# Patient Record
Sex: Female | Born: 1975 | Race: White | Hispanic: No | Marital: Single | Smoking: Current every day smoker
Health system: Southern US, Community
[De-identification: ages and names within clinical notes are randomized; demographics above are authoritative.]

## PROBLEM LIST (undated history)

## (undated) DIAGNOSIS — F329 Major depressive disorder, single episode, unspecified: Secondary | ICD-10-CM

## (undated) DIAGNOSIS — R87619 Unspecified abnormal cytological findings in specimens from cervix uteri: Secondary | ICD-10-CM

## (undated) DIAGNOSIS — F419 Anxiety disorder, unspecified: Secondary | ICD-10-CM

## (undated) DIAGNOSIS — IMO0002 Reserved for concepts with insufficient information to code with codable children: Secondary | ICD-10-CM

## (undated) DIAGNOSIS — F32A Depression, unspecified: Secondary | ICD-10-CM

## (undated) HISTORY — PX: CHOLECYSTECTOMY, LAPAROSCOPIC: SHX56

## (undated) HISTORY — DX: Major depressive disorder, single episode, unspecified: F32.9

## (undated) HISTORY — DX: Unspecified abnormal cytological findings in specimens from cervix uteri: R87.619

## (undated) HISTORY — DX: Reserved for concepts with insufficient information to code with codable children: IMO0002

## (undated) HISTORY — DX: Depression, unspecified: F32.A

## (undated) HISTORY — DX: Anxiety disorder, unspecified: F41.9

## (undated) HISTORY — PX: OTHER SURGICAL HISTORY: SHX169

---

## 2003-03-21 ENCOUNTER — Emergency Department (HOSPITAL_COMMUNITY): Admission: EM | Admit: 2003-03-21 | Discharge: 2003-03-21 | Payer: Self-pay | Admitting: Emergency Medicine

## 2003-10-25 HISTORY — PX: LEEP: SHX91

## 2005-11-22 ENCOUNTER — Emergency Department (HOSPITAL_COMMUNITY): Admission: EM | Admit: 2005-11-22 | Discharge: 2005-11-22 | Payer: Self-pay | Admitting: Emergency Medicine

## 2013-06-05 ENCOUNTER — Ambulatory Visit (INDEPENDENT_AMBULATORY_CARE_PROVIDER_SITE_OTHER): Payer: Medicaid Other | Admitting: Adult Health

## 2013-06-05 ENCOUNTER — Encounter: Payer: Self-pay | Admitting: Adult Health

## 2013-06-05 VITALS — BP 98/64 | Ht 64.0 in | Wt 208.0 lb

## 2013-06-05 DIAGNOSIS — Z3201 Encounter for pregnancy test, result positive: Secondary | ICD-10-CM

## 2013-06-05 DIAGNOSIS — Z3202 Encounter for pregnancy test, result negative: Secondary | ICD-10-CM

## 2013-06-05 LAB — POCT URINE PREGNANCY: Preg Test, Ur: POSITIVE

## 2013-06-11 ENCOUNTER — Other Ambulatory Visit: Payer: Self-pay | Admitting: Obstetrics & Gynecology

## 2013-06-11 DIAGNOSIS — O3680X0 Pregnancy with inconclusive fetal viability, not applicable or unspecified: Secondary | ICD-10-CM

## 2013-06-13 ENCOUNTER — Ambulatory Visit (INDEPENDENT_AMBULATORY_CARE_PROVIDER_SITE_OTHER): Payer: Medicaid Other

## 2013-06-13 DIAGNOSIS — O09521 Supervision of elderly multigravida, first trimester: Secondary | ICD-10-CM

## 2013-06-13 DIAGNOSIS — O3680X0 Pregnancy with inconclusive fetal viability, not applicable or unspecified: Secondary | ICD-10-CM

## 2013-06-13 DIAGNOSIS — O26849 Uterine size-date discrepancy, unspecified trimester: Secondary | ICD-10-CM

## 2013-06-13 DIAGNOSIS — O09529 Supervision of elderly multigravida, unspecified trimester: Secondary | ICD-10-CM

## 2013-06-13 NOTE — Progress Notes (Signed)
U/S-single IUP with +FCA noted, CRL c/w 10+1wks EDD 01/08/2014, cx long and closed 3.8cm, bilateral adnexa WNL

## 2013-06-14 ENCOUNTER — Other Ambulatory Visit: Payer: Self-pay | Admitting: Obstetrics & Gynecology

## 2013-06-14 DIAGNOSIS — Z36 Encounter for antenatal screening of mother: Secondary | ICD-10-CM

## 2013-06-14 DIAGNOSIS — O09521 Supervision of elderly multigravida, first trimester: Secondary | ICD-10-CM

## 2013-06-27 ENCOUNTER — Encounter: Payer: Self-pay | Admitting: Advanced Practice Midwife

## 2013-06-27 ENCOUNTER — Ambulatory Visit (INDEPENDENT_AMBULATORY_CARE_PROVIDER_SITE_OTHER): Payer: Medicaid Other | Admitting: Advanced Practice Midwife

## 2013-06-27 ENCOUNTER — Other Ambulatory Visit (HOSPITAL_COMMUNITY)
Admission: RE | Admit: 2013-06-27 | Discharge: 2013-06-27 | Disposition: A | Discharge status: Home / Self Care | Payer: Medicaid Other | Source: Ambulatory Visit | Attending: Advanced Practice Midwife | Admitting: Advanced Practice Midwife

## 2013-06-27 ENCOUNTER — Other Ambulatory Visit: Payer: Self-pay | Admitting: Advanced Practice Midwife

## 2013-06-27 ENCOUNTER — Ambulatory Visit (INDEPENDENT_AMBULATORY_CARE_PROVIDER_SITE_OTHER): Payer: Medicaid Other

## 2013-06-27 VITALS — BP 102/80 | Wt 208.5 lb

## 2013-06-27 DIAGNOSIS — O3440 Maternal care for other abnormalities of cervix, unspecified trimester: Secondary | ICD-10-CM

## 2013-06-27 DIAGNOSIS — O09291 Supervision of pregnancy with other poor reproductive or obstetric history, first trimester: Secondary | ICD-10-CM

## 2013-06-27 DIAGNOSIS — O99340 Other mental disorders complicating pregnancy, unspecified trimester: Secondary | ICD-10-CM

## 2013-06-27 DIAGNOSIS — O09521 Supervision of elderly multigravida, first trimester: Secondary | ICD-10-CM

## 2013-06-27 DIAGNOSIS — Z1151 Encounter for screening for human papillomavirus (HPV): Secondary | ICD-10-CM | POA: Insufficient documentation

## 2013-06-27 DIAGNOSIS — O09299 Supervision of pregnancy with other poor reproductive or obstetric history, unspecified trimester: Secondary | ICD-10-CM

## 2013-06-27 DIAGNOSIS — O360990 Maternal care for other rhesus isoimmunization, unspecified trimester, not applicable or unspecified: Secondary | ICD-10-CM

## 2013-06-27 DIAGNOSIS — F419 Anxiety disorder, unspecified: Secondary | ICD-10-CM | POA: Insufficient documentation

## 2013-06-27 DIAGNOSIS — Z331 Pregnant state, incidental: Secondary | ICD-10-CM

## 2013-06-27 DIAGNOSIS — Z1389 Encounter for screening for other disorder: Secondary | ICD-10-CM

## 2013-06-27 DIAGNOSIS — Z3490 Encounter for supervision of normal pregnancy, unspecified, unspecified trimester: Secondary | ICD-10-CM | POA: Insufficient documentation

## 2013-06-27 DIAGNOSIS — O09529 Supervision of elderly multigravida, unspecified trimester: Secondary | ICD-10-CM

## 2013-06-27 DIAGNOSIS — F329 Major depressive disorder, single episode, unspecified: Secondary | ICD-10-CM | POA: Insufficient documentation

## 2013-06-27 DIAGNOSIS — Z113 Encounter for screening for infections with a predominantly sexual mode of transmission: Secondary | ICD-10-CM | POA: Insufficient documentation

## 2013-06-27 DIAGNOSIS — Z9889 Other specified postprocedural states: Secondary | ICD-10-CM | POA: Insufficient documentation

## 2013-06-27 DIAGNOSIS — Z36 Encounter for antenatal screening of mother: Secondary | ICD-10-CM

## 2013-06-27 DIAGNOSIS — Z01419 Encounter for gynecological examination (general) (routine) without abnormal findings: Secondary | ICD-10-CM | POA: Insufficient documentation

## 2013-06-27 DIAGNOSIS — Z3481 Encounter for supervision of other normal pregnancy, first trimester: Secondary | ICD-10-CM

## 2013-06-27 LAB — CBC
HCT: 39.9 % (ref 36.0–46.0)
Hemoglobin: 13.4 g/dL (ref 12.0–15.0)
MCV: 91.7 fL (ref 78.0–100.0)
RBC: 4.35 MIL/uL (ref 3.87–5.11)
RDW: 13.9 % (ref 11.5–15.5)
WBC: 10 10*3/uL (ref 4.0–10.5)

## 2013-06-27 LAB — POCT URINALYSIS DIPSTICK
Glucose, UA: NEGATIVE
Leukocytes, UA: NEGATIVE
Nitrite, UA: NEGATIVE

## 2013-06-27 MED ORDER — CONCEPT DHA 53.5-38-1 MG PO CAPS: 1.0000 | ORAL_CAPSULE | ORAL | Status: DC

## 2013-06-27 NOTE — Progress Notes (Signed)
Pain after intercourse. Backpain yesterday. New OB packet given. Consents signed. CCNC filled out.

## 2013-06-27 NOTE — Progress Notes (Signed)
  Subjective:    Karen Kline is a [id number] [redacted]w[redacted]d being seen today for her first obstetrical visit.  Her obstetrical history is significant for HIstory of baby tetrology of fallot..  Pregnancy history fully reviewed.  Patient reports backache and no bleeding.  Filed Vitals:   06/27/13 1036  BP: 102/80  Weight: 208 lb 8 oz (94.575 kg)    HISTORY: OB History  Gravida Para Term Preterm AB SAB TAB Ectopic Multiple Living  7 4 4  2 1 1   4     # Outcome Date GA Lbr Len/2nd Weight Sex Delivery Anes PTL Lv  7 CUR           6 TRM 2013 [redacted]w[redacted]d  6 lb 13 oz (3.09 kg) F SVD EPI  Y     Comments: tetrology of fallot; elective IOL  5 SAB 2000          4 TAB 1999          3 TRM 1999 [redacted]w[redacted]d  6 lb 8 oz (2.948 kg) F SVD   Y  2 TRM 1996 [redacted]w[redacted]d  6 lb (2.722 kg) M SVD None  Y     Comments: elective IOL  1 TRM 1994 [redacted]w[redacted]d  6 lb 8 oz (2.948 kg) F SVD None  Y     Past Medical History  Diagnosis Date  . Anxiety   . Abnormal Pap smear   . Depression    Past Surgical History  Procedure Laterality Date  . Leep  2005  . Cholecystectomy    . Tooth abstraction     Family History  Problem Relation Age of Onset  . Cancer Other     breast  . Emphysema Other     father  . Heart disease Daughter     congenital heart defect     Exam       Pelvic Exam:    Perineum: Normal Perineum   Vulva: normal   Vagina:  normal mucosa, normal discharge, no palpable nodules   Uterus         Cervix: normal   Adnexa: Not palpable   Urinary:  urethral meatus normal    System:     Skin: normal coloration and turgor, no rashes    Neurologic: oriented, normal, normal mood   Extremities: normal strength, tone, and muscle mass   HEENT PERRLA   Mouth/Teeth mucous membranes moist, pharynx normal without lesions   Neck supple and no masses   Cardiovascular: regular rate and rhythm   Respiratory:  appears well, vitals normal, no respiratory distress, acyanotic, normal RR   Abdomen: soft, non-tender; bowel sounds  normal; no masses,  no organomegaly          Assessment:    Pregnancy: [id number] Patient Active Problem List   Diagnosis Date Noted  . Pregnant 06/27/2013  . History of loop electrical excision procedure (LEEP) 06/27/2013  . Depression 06/27/2013  . Anxiety 06/27/2013  . Prior pregnancy with congenital cardiac defect, antepartum 06/27/2013        Plan:     Initial labs drawn. Prenatal vitamins. Problem list reviewed and updated. Pt refuses to decrease xanax.  Cat D/neonatal withdrawal discussed Genetic Screening discussed Integrated Screen: requested.  Ultrasound discussed; fetal survey: requested. Will get fetal echo after 20 weeks  Follow up in 4 weeks.  CRESENZO-DISHMAN,Joseeduardo Brix 06/27/2013

## 2013-06-27 NOTE — Progress Notes (Signed)
U/S(12+1wks)-single IUP with +FCA noted, CRL c/w dates, NB present, NT=1.57mm, bilateral adnexa wnl, no free fluid noted, cx long and closed 3.3cm

## 2013-06-28 LAB — URINALYSIS
Bilirubin Urine: NEGATIVE
Glucose, UA: NEGATIVE mg/dL
Hgb urine dipstick: NEGATIVE
Leukocytes, UA: NEGATIVE
Nitrite: NEGATIVE
Protein, ur: NEGATIVE mg/dL
Specific Gravity, Urine: >1.03 — ABNORMAL HIGH (ref 1.005–1.030)
Urobilinogen, UA: 0.2 mg/dL (ref 0.0–1.0)
pH: 6 (ref 5.0–8.0)

## 2013-06-28 LAB — DRUG SCREEN, URINE, NO CONFIRMATION
Amphetamine Screen, Ur: NEGATIVE
Benzodiazepines.: POSITIVE — AB
Cocaine Metabolites: NEGATIVE
Creatinine,U: 242.6 mg/dL
Phencyclidine (PCP): NEGATIVE

## 2013-06-28 LAB — CYSTIC FIBROSIS DIAGNOSTIC STUDY

## 2013-06-28 LAB — URINE CULTURE
Colony Count: NO GROWTH
Organism ID, Bacteria: NO GROWTH

## 2013-06-28 LAB — ABO AND RH: Rh Type: NEGATIVE

## 2013-06-28 LAB — OXYCODONE SCREEN, UA, RFLX CONFIRM: Oxycodone Screen, Ur: NEGATIVE ng/mL

## 2013-06-28 LAB — VARICELLA ZOSTER ANTIBODY, IGG: Varicella IgG: 186.4 Index — ABNORMAL HIGH (ref <135.00)

## 2013-07-25 ENCOUNTER — Other Ambulatory Visit: Payer: Self-pay | Admitting: Advanced Practice Midwife

## 2013-07-25 ENCOUNTER — Ambulatory Visit (INDEPENDENT_AMBULATORY_CARE_PROVIDER_SITE_OTHER): Payer: Medicaid Other | Admitting: Advanced Practice Midwife

## 2013-07-25 ENCOUNTER — Encounter: Payer: Self-pay | Admitting: Advanced Practice Midwife

## 2013-07-25 VITALS — BP 100/60 | Wt 215.0 lb

## 2013-07-25 DIAGNOSIS — O09299 Supervision of pregnancy with other poor reproductive or obstetric history, unspecified trimester: Secondary | ICD-10-CM

## 2013-07-25 DIAGNOSIS — Z3490 Encounter for supervision of normal pregnancy, unspecified, unspecified trimester: Secondary | ICD-10-CM

## 2013-07-25 DIAGNOSIS — O99340 Other mental disorders complicating pregnancy, unspecified trimester: Secondary | ICD-10-CM

## 2013-07-25 DIAGNOSIS — O360990 Maternal care for other rhesus isoimmunization, unspecified trimester, not applicable or unspecified: Secondary | ICD-10-CM

## 2013-07-25 DIAGNOSIS — Z331 Pregnant state, incidental: Secondary | ICD-10-CM

## 2013-07-25 DIAGNOSIS — Z1389 Encounter for screening for other disorder: Secondary | ICD-10-CM

## 2013-07-25 DIAGNOSIS — O3440 Maternal care for other abnormalities of cervix, unspecified trimester: Secondary | ICD-10-CM

## 2013-07-25 DIAGNOSIS — Z3482 Encounter for supervision of other normal pregnancy, second trimester: Secondary | ICD-10-CM

## 2013-07-25 LAB — POCT URINALYSIS DIPSTICK
Leukocytes, UA: NEGATIVE
Protein, UA: NEGATIVE

## 2013-07-25 NOTE — Progress Notes (Signed)
Pt here today for routine visit and 2nd IT. Pt denies any problems or concerns at this time. Discussed low carb modified diet. F/U 3 weeks for anatomy scan.

## 2013-07-31 LAB — MATERNAL SCREEN, INTEGRATED #2
AFP MoM: 0.99
AFP, Serum: 30.1 ng/mL
Crown Rump Length: 59.9 mm
Estriol Mom: 0.74
Estriol, Free: 0.61 ng/mL
MSS Trisomy 18 Risk: <1:5000 {titer}
Nuchal Translucency: 1.36 mm
PAPP-A MoM: 3.39
hCG, Serum: 30.1 IU/mL

## 2013-08-15 ENCOUNTER — Other Ambulatory Visit: Payer: Self-pay | Admitting: Advanced Practice Midwife

## 2013-08-15 ENCOUNTER — Ambulatory Visit (INDEPENDENT_AMBULATORY_CARE_PROVIDER_SITE_OTHER): Payer: Medicaid Other | Admitting: Obstetrics & Gynecology

## 2013-08-15 ENCOUNTER — Ambulatory Visit (INDEPENDENT_AMBULATORY_CARE_PROVIDER_SITE_OTHER): Payer: Medicaid Other

## 2013-08-15 ENCOUNTER — Encounter: Payer: Self-pay | Admitting: Obstetrics & Gynecology

## 2013-08-15 VITALS — BP 100/60 | Wt 214.0 lb

## 2013-08-15 DIAGNOSIS — O09522 Supervision of elderly multigravida, second trimester: Secondary | ICD-10-CM

## 2013-08-15 DIAGNOSIS — O3440 Maternal care for other abnormalities of cervix, unspecified trimester: Secondary | ICD-10-CM

## 2013-08-15 DIAGNOSIS — O09292 Supervision of pregnancy with other poor reproductive or obstetric history, second trimester: Secondary | ICD-10-CM

## 2013-08-15 DIAGNOSIS — O99340 Other mental disorders complicating pregnancy, unspecified trimester: Secondary | ICD-10-CM

## 2013-08-15 DIAGNOSIS — Z3482 Encounter for supervision of other normal pregnancy, second trimester: Secondary | ICD-10-CM

## 2013-08-15 DIAGNOSIS — O360990 Maternal care for other rhesus isoimmunization, unspecified trimester, not applicable or unspecified: Secondary | ICD-10-CM

## 2013-08-15 DIAGNOSIS — O09299 Supervision of pregnancy with other poor reproductive or obstetric history, unspecified trimester: Secondary | ICD-10-CM

## 2013-08-15 DIAGNOSIS — F1920 Other psychoactive substance dependence, uncomplicated: Secondary | ICD-10-CM

## 2013-08-15 DIAGNOSIS — Z1389 Encounter for screening for other disorder: Secondary | ICD-10-CM

## 2013-08-15 DIAGNOSIS — O09529 Supervision of elderly multigravida, unspecified trimester: Secondary | ICD-10-CM

## 2013-08-15 DIAGNOSIS — Z331 Pregnant state, incidental: Secondary | ICD-10-CM

## 2013-08-15 LAB — POCT URINALYSIS DIPSTICK
Glucose, UA: NEGATIVE
Leukocytes, UA: NEGATIVE
Nitrite, UA: NEGATIVE
Protein, UA: 1

## 2013-08-15 NOTE — Progress Notes (Signed)
U/S(19+1wks)-active fetus, meas c/w dates, fluid wnl, post gr 0 plac, no obvious abnl noted, although unable to view anatomy well due to fetal position, would like to reck anat at 26weeks, cx long and closed 3.11cm, bilateral adnexa wnl, female fetus

## 2013-08-15 NOTE — Progress Notes (Signed)
For Up U/S.

## 2013-08-15 NOTE — Progress Notes (Signed)
Sonogram read and reviewed BP weight and urine results all reviewed and noted. Patient reports good fetal movement, denies any bleeding and no rupture of membranes symptoms or regular contractions. Patient is without complaints. All questions were answered.

## 2013-08-29 ENCOUNTER — Other Ambulatory Visit: Payer: Self-pay

## 2013-09-12 ENCOUNTER — Encounter: Payer: Medicaid Other | Admitting: Obstetrics & Gynecology

## 2013-09-12 ENCOUNTER — Encounter: Payer: Medicaid Other | Admitting: Advanced Practice Midwife

## 2013-09-17 ENCOUNTER — Ambulatory Visit (INDEPENDENT_AMBULATORY_CARE_PROVIDER_SITE_OTHER): Payer: Medicaid Other | Admitting: Obstetrics & Gynecology

## 2013-09-17 ENCOUNTER — Encounter: Payer: Self-pay | Admitting: Obstetrics & Gynecology

## 2013-09-17 VITALS — BP 100/70 | Wt 218.0 lb

## 2013-09-17 DIAGNOSIS — Z331 Pregnant state, incidental: Secondary | ICD-10-CM

## 2013-09-17 DIAGNOSIS — O360990 Maternal care for other rhesus isoimmunization, unspecified trimester, not applicable or unspecified: Secondary | ICD-10-CM

## 2013-09-17 DIAGNOSIS — O09299 Supervision of pregnancy with other poor reproductive or obstetric history, unspecified trimester: Secondary | ICD-10-CM

## 2013-09-17 DIAGNOSIS — O99340 Other mental disorders complicating pregnancy, unspecified trimester: Secondary | ICD-10-CM

## 2013-09-17 DIAGNOSIS — Z3482 Encounter for supervision of other normal pregnancy, second trimester: Secondary | ICD-10-CM

## 2013-09-17 DIAGNOSIS — Z3480 Encounter for supervision of other normal pregnancy, unspecified trimester: Secondary | ICD-10-CM | POA: Insufficient documentation

## 2013-09-17 DIAGNOSIS — Z1389 Encounter for screening for other disorder: Secondary | ICD-10-CM

## 2013-09-17 DIAGNOSIS — O3440 Maternal care for other abnormalities of cervix, unspecified trimester: Secondary | ICD-10-CM

## 2013-09-17 LAB — POCT URINALYSIS DIPSTICK: Ketones, UA: NEGATIVE

## 2013-09-17 NOTE — Addendum Note (Signed)
Addended by: Criss Alvine on: 09/17/2013 04:31 PM   Modules accepted: Orders

## 2013-09-17 NOTE — Progress Notes (Signed)
BP weight and urine results all reviewed and noted. Patient reports good fetal movement, denies any bleeding and no rupture of membranes symptoms or regular contractions. Patient is without complaints. All questions were answered.  

## 2013-10-15 ENCOUNTER — Encounter: Payer: Medicaid Other | Admitting: Obstetrics & Gynecology

## 2013-10-15 ENCOUNTER — Other Ambulatory Visit: Payer: Medicaid Other

## 2013-10-22 ENCOUNTER — Other Ambulatory Visit: Payer: Medicaid Other

## 2013-10-22 ENCOUNTER — Ambulatory Visit (INDEPENDENT_AMBULATORY_CARE_PROVIDER_SITE_OTHER): Payer: Medicaid Other | Admitting: Advanced Practice Midwife

## 2013-10-22 VITALS — BP 110/64 | Wt 215.6 lb

## 2013-10-22 DIAGNOSIS — Z1389 Encounter for screening for other disorder: Secondary | ICD-10-CM

## 2013-10-22 DIAGNOSIS — O09299 Supervision of pregnancy with other poor reproductive or obstetric history, unspecified trimester: Secondary | ICD-10-CM

## 2013-10-22 DIAGNOSIS — Z3480 Encounter for supervision of other normal pregnancy, unspecified trimester: Secondary | ICD-10-CM

## 2013-10-22 DIAGNOSIS — O3440 Maternal care for other abnormalities of cervix, unspecified trimester: Secondary | ICD-10-CM

## 2013-10-22 DIAGNOSIS — O360990 Maternal care for other rhesus isoimmunization, unspecified trimester, not applicable or unspecified: Secondary | ICD-10-CM

## 2013-10-22 DIAGNOSIS — Z331 Pregnant state, incidental: Secondary | ICD-10-CM

## 2013-10-22 DIAGNOSIS — O99340 Other mental disorders complicating pregnancy, unspecified trimester: Secondary | ICD-10-CM

## 2013-10-22 LAB — CBC
HCT: 35.9 % — ABNORMAL LOW (ref 36.0–46.0)
Hemoglobin: 12.1 g/dL (ref 12.0–15.0)
MCHC: 33.7 g/dL (ref 30.0–36.0)
MCV: 96.5 fL (ref 78.0–100.0)
Platelets: 217 10*3/uL (ref 150–400)
RDW: 13.2 % (ref 11.5–15.5)
WBC: 9 10*3/uL (ref 4.0–10.5)

## 2013-10-22 LAB — POCT URINALYSIS DIPSTICK
Blood, UA: NEGATIVE
Glucose, UA: NEGATIVE
Nitrite, UA: NEGATIVE

## 2013-10-22 MED ORDER — PHENAZOPYRIDINE HCL 200 MG PO TABS: 200.0000 mg | ORAL_TABLET | Freq: Three times a day (TID) | ORAL | Status: DC | PRN

## 2013-10-22 NOTE — Progress Notes (Signed)
Doing PN2.  C/O dysuria and pressure and frequency.    U/A dip negative. Will culture and rx pyridium for a few days.  Has been coughing and URI sx for 2 days.  Lungs  LCTAB.  OTC meds for now.  Has u/s scheduled for 1/6.

## 2013-10-23 LAB — ANTIBODY SCREEN: Antibody Screen: NEGATIVE

## 2013-10-23 LAB — RPR

## 2013-10-23 LAB — GLUCOSE TOLERANCE, 2 HOURS W/ 1HR
Glucose, 1 hour: 132 mg/dL (ref 70–170)
Glucose, 2 hour: 100 mg/dL (ref 70–139)
Glucose, Fasting: 87 mg/dL (ref 70–99)

## 2013-10-23 LAB — HIV ANTIBODY (ROUTINE TESTING W REFLEX): HIV: NONREACTIVE

## 2013-10-23 LAB — HSV 2 ANTIBODY, IGG: HSV 2 Glycoprotein G Ab, IgG: 0.1 IV

## 2013-10-24 LAB — URINE CULTURE: Colony Count: >=100000

## 2013-10-24 NOTE — L&D Delivery Note (Signed)
Delivery Note At 5:41 PM a healthy female was delivered via Vaginal, Spontaneous Delivery (Presentation: OA ).  APGAR: 9, 9; weight TBD.   Placenta status: intact, 3 vessel Cord:  with the following complications: None.  Cord pH: na  Anesthesia: Epidural  Episiotomy: None Lacerations: None Suture Repair: na Est. Blood Loss (mL): 350  Upon arrival patient was complete and intact. Her membranes spontaneously ruptured with pushing. She pushed with good maternal effort to deliver a healthy girl. Baby delivered without difficulty, was noted to have good tone and place on maternal abdomen for oral suctioning, drying and stimulation. Her umbilical cord was reduced from around her shoulder/abdomen. Delayed cord clamping performed and cut by Nurse. Placenta delivered intact with 3V cord. Vaginal canal and perineum was inspected and was intact. Pitocin was started and uterus massaged bi-manually until bleeding slowed. Counts of sharps, instruments, and lap pads were all correct.    Mom to postpartum.  Baby to Couplet care / Skin to Skin.  Wenda LowJoyner, James 01/09/2014, 6:04 PM  I was present for and supervised the delivery of this newborn. I agree with above.   Gottlieb Zuercher, Redmond BasemanKELI L, MD

## 2013-10-29 ENCOUNTER — Ambulatory Visit (INDEPENDENT_AMBULATORY_CARE_PROVIDER_SITE_OTHER): Payer: Medicaid Other

## 2013-10-29 ENCOUNTER — Encounter: Payer: Self-pay | Admitting: Obstetrics & Gynecology

## 2013-10-29 ENCOUNTER — Other Ambulatory Visit: Payer: Self-pay | Admitting: Obstetrics & Gynecology

## 2013-10-29 ENCOUNTER — Ambulatory Visit (INDEPENDENT_AMBULATORY_CARE_PROVIDER_SITE_OTHER): Payer: Medicaid Other | Admitting: Obstetrics & Gynecology

## 2013-10-29 ENCOUNTER — Other Ambulatory Visit: Payer: Medicaid Other

## 2013-10-29 ENCOUNTER — Encounter: Payer: Medicaid Other | Admitting: Advanced Practice Midwife

## 2013-10-29 VITALS — BP 100/60 | Wt 216.0 lb

## 2013-10-29 DIAGNOSIS — Z3482 Encounter for supervision of other normal pregnancy, second trimester: Secondary | ICD-10-CM

## 2013-10-29 DIAGNOSIS — O09299 Supervision of pregnancy with other poor reproductive or obstetric history, unspecified trimester: Secondary | ICD-10-CM

## 2013-10-29 DIAGNOSIS — O09523 Supervision of elderly multigravida, third trimester: Secondary | ICD-10-CM

## 2013-10-29 DIAGNOSIS — Z1389 Encounter for screening for other disorder: Secondary | ICD-10-CM

## 2013-10-29 DIAGNOSIS — F1920 Other psychoactive substance dependence, uncomplicated: Secondary | ICD-10-CM

## 2013-10-29 DIAGNOSIS — Z8279 Family history of other congenital malformations, deformations and chromosomal abnormalities: Secondary | ICD-10-CM

## 2013-10-29 DIAGNOSIS — O99320 Drug use complicating pregnancy, unspecified trimester: Secondary | ICD-10-CM

## 2013-10-29 DIAGNOSIS — Z331 Pregnant state, incidental: Secondary | ICD-10-CM

## 2013-10-29 DIAGNOSIS — Z3483 Encounter for supervision of other normal pregnancy, third trimester: Secondary | ICD-10-CM

## 2013-10-29 DIAGNOSIS — O3440 Maternal care for other abnormalities of cervix, unspecified trimester: Secondary | ICD-10-CM

## 2013-10-29 DIAGNOSIS — O09529 Supervision of elderly multigravida, unspecified trimester: Secondary | ICD-10-CM

## 2013-10-29 DIAGNOSIS — Z6791 Unspecified blood type, Rh negative: Secondary | ICD-10-CM

## 2013-10-29 DIAGNOSIS — O360990 Maternal care for other rhesus isoimmunization, unspecified trimester, not applicable or unspecified: Secondary | ICD-10-CM

## 2013-10-29 DIAGNOSIS — O99340 Other mental disorders complicating pregnancy, unspecified trimester: Secondary | ICD-10-CM

## 2013-10-29 LAB — POCT URINALYSIS DIPSTICK
Blood, UA: NEGATIVE
Glucose, UA: NEGATIVE
KETONES UA: NEGATIVE
Leukocytes, UA: NEGATIVE
Nitrite, UA: NEGATIVE

## 2013-10-29 MED ORDER — OMEPRAZOLE 20 MG PO CPDR: 20.0000 mg | DELAYED_RELEASE_CAPSULE | Freq: Every day | ORAL | Status: DC

## 2013-10-29 MED ORDER — RHO D IMMUNE GLOBULIN 1500 UNIT/2ML IJ SOLN
300.0000 ug | Freq: Once | INTRAMUSCULAR | Status: AC
  Administered 2013-10-29: 300 ug via INTRAMUSCULAR

## 2013-10-29 NOTE — Addendum Note (Signed)
Addended by: Criss AlvinePULLIAM, Ashiya Kinkead G on: 10/29/2013 12:05 PM   Modules accepted: Orders

## 2013-10-29 NOTE — Addendum Note (Signed)
Addended by: Criss AlvinePULLIAM, Martina Brodbeck G on: 10/29/2013 11:46 AM   Modules accepted: Orders

## 2013-10-29 NOTE — Progress Notes (Signed)
Sonogram report noted and done BP weight and urine results all reviewed and noted. Patient reports good fetal movement, denies any bleeding and no rupture of membranes symptoms or regular contractions. Patient is without complaints. All questions were answered. BTL papers

## 2013-10-29 NOTE — Progress Notes (Signed)
U/S(29+6wks)-vtx active fetus, meas c/w dates EFW 3 lb 4 oz (52nd%tile), fluid wnl, posterior Gr 1 placenta, cx appears closed (3.2cm), FHR-150 bpm, female fetus, nose/lips noted today, OFT's noted also on today's exam, 4 chamber view of the heart obtatined, no OBVIOUS abnl noted

## 2013-11-19 ENCOUNTER — Ambulatory Visit (INDEPENDENT_AMBULATORY_CARE_PROVIDER_SITE_OTHER): Payer: Medicaid Other | Admitting: Obstetrics & Gynecology

## 2013-11-19 ENCOUNTER — Encounter: Payer: Self-pay | Admitting: Obstetrics & Gynecology

## 2013-11-19 VITALS — BP 100/60 | Wt 209.0 lb

## 2013-11-19 DIAGNOSIS — Z331 Pregnant state, incidental: Secondary | ICD-10-CM

## 2013-11-19 DIAGNOSIS — O09299 Supervision of pregnancy with other poor reproductive or obstetric history, unspecified trimester: Secondary | ICD-10-CM

## 2013-11-19 DIAGNOSIS — Z1389 Encounter for screening for other disorder: Secondary | ICD-10-CM

## 2013-11-19 DIAGNOSIS — O3440 Maternal care for other abnormalities of cervix, unspecified trimester: Secondary | ICD-10-CM

## 2013-11-19 DIAGNOSIS — O360990 Maternal care for other rhesus isoimmunization, unspecified trimester, not applicable or unspecified: Secondary | ICD-10-CM

## 2013-11-19 DIAGNOSIS — O99320 Drug use complicating pregnancy, unspecified trimester: Secondary | ICD-10-CM

## 2013-11-19 DIAGNOSIS — F1920 Other psychoactive substance dependence, uncomplicated: Secondary | ICD-10-CM

## 2013-11-19 LAB — POCT URINALYSIS DIPSTICK
Blood, UA: NEGATIVE
GLUCOSE UA: NEGATIVE
KETONES UA: NEGATIVE
LEUKOCYTES UA: NEGATIVE
Nitrite, UA: NEGATIVE
Protein, UA: NEGATIVE

## 2013-11-19 NOTE — Progress Notes (Signed)
BP weight and urine results all reviewed and noted. Patient reports good fetal movement, denies any bleeding and no rupture of membranes symptoms or regular contractions. Patient is without complaints. All questions were answered.  

## 2013-12-03 ENCOUNTER — Encounter: Payer: Medicaid Other | Admitting: Women's Health

## 2013-12-04 ENCOUNTER — Ambulatory Visit (INDEPENDENT_AMBULATORY_CARE_PROVIDER_SITE_OTHER): Payer: Medicaid Other | Admitting: Women's Health

## 2013-12-04 VITALS — BP 104/70 | Wt 205.0 lb

## 2013-12-04 DIAGNOSIS — O3440 Maternal care for other abnormalities of cervix, unspecified trimester: Secondary | ICD-10-CM

## 2013-12-04 DIAGNOSIS — F1920 Other psychoactive substance dependence, uncomplicated: Secondary | ICD-10-CM

## 2013-12-04 DIAGNOSIS — O360990 Maternal care for other rhesus isoimmunization, unspecified trimester, not applicable or unspecified: Secondary | ICD-10-CM

## 2013-12-04 DIAGNOSIS — O99340 Other mental disorders complicating pregnancy, unspecified trimester: Secondary | ICD-10-CM

## 2013-12-04 DIAGNOSIS — Z3490 Encounter for supervision of normal pregnancy, unspecified, unspecified trimester: Secondary | ICD-10-CM

## 2013-12-04 DIAGNOSIS — Z331 Pregnant state, incidental: Secondary | ICD-10-CM

## 2013-12-04 DIAGNOSIS — O99320 Drug use complicating pregnancy, unspecified trimester: Secondary | ICD-10-CM

## 2013-12-04 DIAGNOSIS — O09299 Supervision of pregnancy with other poor reproductive or obstetric history, unspecified trimester: Secondary | ICD-10-CM

## 2013-12-04 DIAGNOSIS — Z1389 Encounter for screening for other disorder: Secondary | ICD-10-CM

## 2013-12-04 LAB — POCT URINALYSIS DIPSTICK
Glucose, UA: NEGATIVE
KETONES UA: NEGATIVE
NITRITE UA: NEGATIVE
RBC UA: NEGATIVE

## 2013-12-04 NOTE — Patient Instructions (Signed)
Third Trimester of Pregnancy  The third trimester is from week 29 through week 42, months 7 through 9. The third trimester is a time when the fetus is growing rapidly. At the end of the ninth month, the fetus is about 20 inches in length and weighs 6 10 pounds.   BODY CHANGES  Your body goes through many changes during pregnancy. The changes vary from woman to woman.    Your weight will continue to increase. You can expect to gain 25 35 pounds (11 16 kg) by the end of the pregnancy.   You may begin to get stretch marks on your hips, abdomen, and breasts.   You may urinate more often because the fetus is moving lower into your pelvis and pressing on your bladder.   You may develop or continue to have heartburn as a result of your pregnancy.   You may develop constipation because certain hormones are causing the muscles that push waste through your intestines to slow down.   You may develop hemorrhoids or swollen, bulging veins (varicose veins).   You may have pelvic pain because of the weight gain and pregnancy hormones relaxing your joints between the bones in your pelvis. Back aches may result from over exertion of the muscles supporting your posture.   Your breasts will continue to grow and be tender. A yellow discharge may leak from your breasts called colostrum.   Your belly button may stick out.   You may feel short of breath because of your expanding uterus.   You may notice the fetus "dropping," or moving lower in your abdomen.   You may have a bloody mucus discharge. This usually occurs a few days to a week before labor begins.   Your cervix becomes thin and soft (effaced) near your due date.  WHAT TO EXPECT AT YOUR PRENATAL EXAMS   You will have prenatal exams every 2 weeks until week 36. Then, you will have weekly prenatal exams. During a routine prenatal visit:   You will be weighed to make sure you and the fetus are growing normally.   Your blood pressure is taken.   Your abdomen will be  measured to track your baby's growth.   The fetal heartbeat will be listened to.   Any test results from the previous visit will be discussed.   You may have a cervical check near your due date to see if you have effaced.  At around 36 weeks, your caregiver will check your cervix. At the same time, your caregiver will also perform a test on the secretions of the vaginal tissue. This test is to determine if a type of bacteria, Group B streptococcus, is present. Your caregiver will explain this further.  Your caregiver may ask you:   What your birth plan is.   How you are feeling.   If you are feeling the baby move.   If you have had any abnormal symptoms, such as leaking fluid, bleeding, severe headaches, or abdominal cramping.   If you have any questions.  Other tests or screenings that may be performed during your third trimester include:   Blood tests that check for low iron levels (anemia).   Fetal testing to check the health, activity level, and growth of the fetus. Testing is done if you have certain medical conditions or if there are problems during the pregnancy.  FALSE LABOR  You may feel small, irregular contractions that eventually go away. These are called Braxton Hicks contractions, or   false labor. Contractions may last for hours, days, or even weeks before true labor sets in. If contractions come at regular intervals, intensify, or become painful, it is best to be seen by your caregiver.   SIGNS OF LABOR    Menstrual-like cramps.   Contractions that are 5 minutes apart or less.   Contractions that start on the top of the uterus and spread down to the lower abdomen and back.   A sense of increased pelvic pressure or back pain.   A watery or bloody mucus discharge that comes from the vagina.  If you have any of these signs before the 37th week of pregnancy, call your caregiver right away. You need to go to the hospital to get checked immediately.  HOME CARE INSTRUCTIONS    Avoid all  smoking, herbs, alcohol, and unprescribed drugs. These chemicals affect the formation and growth of the baby.   Follow your caregiver's instructions regarding medicine use. There are medicines that are either safe or unsafe to take during pregnancy.   Exercise only as directed by your caregiver. Experiencing uterine cramps is a good sign to stop exercising.   Continue to eat regular, healthy meals.   Wear a good support bra for breast tenderness.   Do not use hot tubs, steam rooms, or saunas.   Wear your seat belt at all times when driving.   Avoid raw meat, uncooked cheese, cat litter boxes, and soil used by cats. These carry germs that can cause birth defects in the baby.   Take your prenatal vitamins.   Try taking a stool softener (if your caregiver approves) if you develop constipation. Eat more high-fiber foods, such as fresh vegetables or fruit and whole grains. Drink plenty of fluids to keep your urine clear or pale yellow.   Take warm sitz baths to soothe any pain or discomfort caused by hemorrhoids. Use hemorrhoid cream if your caregiver approves.   If you develop varicose veins, wear support hose. Elevate your feet for 15 minutes, 3 4 times a day. Limit salt in your diet.   Avoid heavy lifting, wear low heal shoes, and practice good posture.   Rest a lot with your legs elevated if you have leg cramps or low back pain.   Visit your dentist if you have not gone during your pregnancy. Use a soft toothbrush to brush your teeth and be gentle when you floss.   A sexual relationship may be continued unless your caregiver directs you otherwise.   Do not travel far distances unless it is absolutely necessary and only with the approval of your caregiver.   Take prenatal classes to understand, practice, and ask questions about the labor and delivery.   Make a trial run to the hospital.   Pack your hospital bag.   Prepare the baby's nursery.   Continue to go to all your prenatal visits as directed  by your caregiver.  SEEK MEDICAL CARE IF:   You are unsure if you are in labor or if your water has broken.   You have dizziness.   You have mild pelvic cramps, pelvic pressure, or nagging pain in your abdominal area.   You have persistent nausea, vomiting, or diarrhea.   You have a bad smelling vaginal discharge.   You have pain with urination.  SEEK IMMEDIATE MEDICAL CARE IF:    You have a fever.   You are leaking fluid from your vagina.   You have spotting or bleeding from your vagina.     You have severe abdominal cramping or pain.   You have rapid weight loss or gain.   You have shortness of breath with chest pain.   You notice sudden or extreme swelling of your face, hands, ankles, feet, or legs.   You have not felt your baby move in over an hour.   You have severe headaches that do not go away with medicine.   You have vision changes.  Document Released: 10/04/2001 Document Revised: 06/12/2013 Document Reviewed: 12/11/2012  [employer] Patient Information 2014 [employer]

## 2013-12-04 NOTE — Progress Notes (Signed)
Reports good fm. Denies uc's, lof, vb, uti s/s.  Some pressure.  Reviewed ptl s/s, fkc.  All questions answered. F/U in 2wks for visit and gbs.

## 2013-12-18 ENCOUNTER — Encounter: Payer: Self-pay | Admitting: Advanced Practice Midwife

## 2013-12-18 ENCOUNTER — Ambulatory Visit (INDEPENDENT_AMBULATORY_CARE_PROVIDER_SITE_OTHER): Payer: Medicaid Other | Admitting: Advanced Practice Midwife

## 2013-12-18 VITALS — BP 106/62 | Wt 203.0 lb

## 2013-12-18 DIAGNOSIS — Z1389 Encounter for screening for other disorder: Secondary | ICD-10-CM

## 2013-12-18 DIAGNOSIS — O3440 Maternal care for other abnormalities of cervix, unspecified trimester: Secondary | ICD-10-CM

## 2013-12-18 DIAGNOSIS — O0990 Supervision of high risk pregnancy, unspecified, unspecified trimester: Secondary | ICD-10-CM

## 2013-12-18 DIAGNOSIS — Z3490 Encounter for supervision of normal pregnancy, unspecified, unspecified trimester: Secondary | ICD-10-CM

## 2013-12-18 DIAGNOSIS — O360990 Maternal care for other rhesus isoimmunization, unspecified trimester, not applicable or unspecified: Secondary | ICD-10-CM

## 2013-12-18 DIAGNOSIS — O99340 Other mental disorders complicating pregnancy, unspecified trimester: Secondary | ICD-10-CM

## 2013-12-18 DIAGNOSIS — F1920 Other psychoactive substance dependence, uncomplicated: Secondary | ICD-10-CM

## 2013-12-18 DIAGNOSIS — Z331 Pregnant state, incidental: Secondary | ICD-10-CM

## 2013-12-18 DIAGNOSIS — O99320 Drug use complicating pregnancy, unspecified trimester: Secondary | ICD-10-CM

## 2013-12-18 DIAGNOSIS — O09299 Supervision of pregnancy with other poor reproductive or obstetric history, unspecified trimester: Secondary | ICD-10-CM

## 2013-12-18 LAB — POCT URINALYSIS DIPSTICK
Glucose, UA: NEGATIVE
Ketones, UA: NEGATIVE
Nitrite, UA: NEGATIVE
Protein, UA: NEGATIVE
RBC UA: NEGATIVE

## 2013-12-18 LAB — OB RESULTS CONSOLE GC/CHLAMYDIA
Chlamydia: NEGATIVE
Gonorrhea: NEGATIVE

## 2013-12-18 LAB — OB RESULTS CONSOLE GBS: GBS: NEGATIVE

## 2013-12-18 NOTE — Progress Notes (Signed)
GBS/CHL/GC collected today.  No c/o at this time.  Routine questions about pregnancy answered.  F/U in 1 weeks for Low-risk ob appt .

## 2013-12-19 LAB — GC/CHLAMYDIA PROBE AMP
CT PROBE, AMP APTIMA: NEGATIVE
GC PROBE AMP APTIMA: NEGATIVE

## 2013-12-21 LAB — CULTURE, BETA STREP (GROUP B ONLY)

## 2013-12-25 ENCOUNTER — Encounter: Payer: Self-pay | Admitting: Obstetrics and Gynecology

## 2013-12-25 ENCOUNTER — Ambulatory Visit (INDEPENDENT_AMBULATORY_CARE_PROVIDER_SITE_OTHER): Payer: Medicaid Other | Admitting: Obstetrics and Gynecology

## 2013-12-25 VITALS — BP 110/70 | Wt 204.0 lb

## 2013-12-25 DIAGNOSIS — O09299 Supervision of pregnancy with other poor reproductive or obstetric history, unspecified trimester: Secondary | ICD-10-CM

## 2013-12-25 DIAGNOSIS — O99320 Drug use complicating pregnancy, unspecified trimester: Secondary | ICD-10-CM

## 2013-12-25 DIAGNOSIS — O99340 Other mental disorders complicating pregnancy, unspecified trimester: Secondary | ICD-10-CM

## 2013-12-25 DIAGNOSIS — Z331 Pregnant state, incidental: Secondary | ICD-10-CM

## 2013-12-25 DIAGNOSIS — Z1389 Encounter for screening for other disorder: Secondary | ICD-10-CM

## 2013-12-25 DIAGNOSIS — F1920 Other psychoactive substance dependence, uncomplicated: Secondary | ICD-10-CM

## 2013-12-25 DIAGNOSIS — Z3490 Encounter for supervision of normal pregnancy, unspecified, unspecified trimester: Secondary | ICD-10-CM

## 2013-12-25 DIAGNOSIS — O3440 Maternal care for other abnormalities of cervix, unspecified trimester: Secondary | ICD-10-CM

## 2013-12-25 DIAGNOSIS — O360990 Maternal care for other rhesus isoimmunization, unspecified trimester, not applicable or unspecified: Secondary | ICD-10-CM

## 2013-12-25 LAB — POCT URINALYSIS DIPSTICK
Glucose, UA: NEGATIVE
Ketones, UA: NEGATIVE
Leukocytes, UA: NEGATIVE
Nitrite, UA: NEGATIVE
RBC UA: NEGATIVE

## 2013-12-25 NOTE — Patient Instructions (Addendum)
Fetal Movement Counts Patient Name: __________________________________________________ Patient Due Date: ____________________ Performing a fetal movement count is highly recommended in high-risk pregnancies, but it is good for every pregnant woman to do. Your caregiver may ask you to start counting fetal movements at 28 weeks of the pregnancy. Fetal movements often increase:  After eating a full meal.  After physical activity.  After eating or drinking something sweet or cold.  At rest. Pay attention to when you feel the baby is most active. This will help you notice a pattern of your baby's sleep and wake cycles and what factors contribute to an increase in fetal movement. It is important to perform a fetal movement count at the same time each day when your baby is normally most active.  HOW TO COUNT FETAL MOVEMENTS 1. Find a quiet and comfortable area to sit or lie down on your left side. Lying on your left side provides the best blood and oxygen circulation to your baby. 2. Write down the day and time on a sheet of paper or in a journal. 3. Start counting kicks, flutters, swishes, rolls, or jabs in a 2 hour period. You should feel at least 10 movements within 2 hours. 4. If you do not feel 10 movements in 2 hours, wait 2 3 hours and count again. Look for a change in the pattern or not enough counts in 2 hours. SEEK MEDICAL CARE IF:  You feel less than 10 counts in 2 hours, tried twice.  There is no movement in over an hour.  The pattern is changing or taking longer each day to reach 10 counts in 2 hours.  You feel the baby is not moving as he or she usually does. Date: ____________ Movements: ____________ Start time: ____________ Finish time: ____________  Date: ____________ Movements: ____________ Start time: ____________ Finish time: ____________ Date: ____________ Movements: ____________ Start time: ____________ Finish time: ____________ Date: ____________ Movements: ____________  Start time: ____________ Finish time: ____________ Date: ____________ Movements: ____________ Start time: ____________ Finish time: ____________ Date: ____________ Movements: ____________ Start time: ____________ Finish time: ____________ Date: ____________ Movements: ____________ Start time: ____________ Finish time: ____________ Date: ____________ Movements: ____________ Start time: ____________ Finish time: ____________  Date: ____________ Movements: ____________ Start time: ____________ Finish time: ____________ Date: ____________ Movements: ____________ Start time: ____________ Finish time: ____________ Date: ____________ Movements: ____________ Start time: ____________ Finish time: ____________ Date: ____________ Movements: ____________ Start time: ____________ Finish time: ____________ Date: ____________ Movements: ____________ Start time: ____________ Finish time: ____________ Date: ____________ Movements: ____________ Start time: ____________ Finish time: ____________ Date: ____________ Movements: ____________ Start time: ____________ Finish time: ____________  Date: ____________ Movements: ____________ Start time: ____________ Finish time: ____________ Date: ____________ Movements: ____________ Start time: ____________ Finish time: ____________ Date: ____________ Movements: ____________ Start time: ____________ Finish time: ____________ Date: ____________ Movements: ____________ Start time: ____________ Finish time: ____________ Date: ____________ Movements: ____________ Start time: ____________ Finish time: ____________ Date: ____________ Movements: ____________ Start time: ____________ Finish time: ____________ Date: ____________ Movements: ____________ Start time: ____________ Finish time: ____________  Date: ____________ Movements: ____________ Start time: ____________ Finish time: ____________ Date: ____________ Movements: ____________ Start time: ____________ Finish time:  ____________ Date: ____________ Movements: ____________ Start time: ____________ Finish time: ____________ Date: ____________ Movements: ____________ Start time: ____________ Finish time: ____________ Date: ____________ Movements: ____________ Start time: ____________ Finish time: ____________ Date: ____________ Movements: ____________ Start time: ____________ Finish time: ____________ Date: ____________ Movements: ____________ Start time: ____________ Finish time: ____________  Date: ____________ Movements: ____________ Start time: ____________ Finish   time: ____________ Date: ____________ Movements: ____________ Start time: ____________ Doreatha MartinFinish time: ____________ Date: ____________ Movements: ____________ Start time: ____________ Doreatha MartinFinish time: ____________ Date: ____________ Movements: ____________ Start time: ____________ Doreatha MartinFinish time: ____________ Date: ____________ Movements: ____________ Start time: ____________ Doreatha MartinFinish time: ____________ Date: ____________ Movements: ____________ Start time: ____________ Doreatha MartinFinish time: ____________ Date: ____________ Movements: ____________ Start time: ____________ Doreatha MartinFinish time: ____________  Date: ____________ Movements: ____________ Start time: ____________ Doreatha MartinFinish time: ____________ Date: ____________ Movements: ____________ Start time: ____________ Doreatha MartinFinish time: ____________ Date: ____________ Movements: ____________ Start time: ____________ Doreatha MartinFinish time: ____________ Date: ____________ Movements: ____________ Start time: ____________ Doreatha MartinFinish time: ____________ Date: ____________ Movements: ____________ Start time: ____________ Doreatha MartinFinish time: ____________ Date: ____________ Movements: ____________ Start time: ____________ Doreatha MartinFinish time: ____________ Date: ____________ Movements: ____________ Start time: ____________ Doreatha MartinFinish time: ____________  Date: ____________ Movements: ____________ Start time: ____________ Doreatha MartinFinish time: ____________ Date: ____________ Movements:  ____________ Start time: ____________ Doreatha MartinFinish time: ____________ Date: ____________ Movements: ____________ Start time: ____________ Doreatha MartinFinish time: ____________ Date: ____________ Movements: ____________ Start time: ____________ Doreatha MartinFinish time: ____________ Date: ____________ Movements: ____________ Start time: ____________ Doreatha MartinFinish time: ____________ Date: ____________ Movements: ____________ Start time: ____________ Doreatha MartinFinish time: ____________ Date: ____________ Movements: ____________ Start time: ____________ Doreatha MartinFinish time: ____________  Date: ____________ Movements: ____________ Start time: ____________ Doreatha MartinFinish time: ____________ Date: ____________ Movements: ____________ Start time: ____________ Doreatha MartinFinish time: ____________ Date: ____________ Movements: ____________ Start time: ____________ Doreatha MartinFinish time: ____________ Date: ____________ Movements: ____________ Start time: ____________ Doreatha MartinFinish time: ____________ Date: ____________ Movements: ____________ Start time: ____________ Doreatha MartinFinish time: ____________ Date: ____________ Movements: ____________ Start time: ____________ Doreatha MartinFinish time: ____________ Document Released: 11/09/2006 Document Revised: 09/26/2012 Document Reviewed: 08/06/2012 [employer] Patient Information 2014 [employer]. Laparoscopic Tubal Ligation Laparoscopic tubal ligation is a procedure that closes the fallopian tubes at a time other than right after childbirth. By closing the fallopian tubes, the eggs that are released from the ovaries cannot enter the uterus and sperm cannot reach the egg. Tubal ligation is also known as getting your "tubes tied." Tubal ligation is done so you will not be able to get pregnant or have a baby.  Although this procedure may be reversed, it should be considered permanent and irreversible. If you want to have future pregnancies, you should not have this procedure.  LET YOUR CAREGIVER KNOW ABOUT:  Allergies to food or medicine.  Medicines taken, including vitamins,  herbs, eyedrops, over-the-counter medicines, and creams.  Use of steroids (by mouth or creams).  Previous problems with numbing medicines.  History of bleeding problems or blood clots.  Any recent colds or infections.  Previous surgery.  Other health problems, including diabetes and kidney problems.  Possibility of pregnancy, if this applies.  Any past pregnancies. RISKS AND COMPLICATIONS   Infection.  Bleeding.  Injury to surrounding organs.  Anesthetic side effects.  Failure of the procedure.  Ectopic pregnancy.  Future regret about having the procedure done. BEFORE THE PROCEDURE  Do not take aspirin or blood thinners a week before the procedure or as directed. This can cause bleeding.  Do not eat or drink anything 6 to 8 hours before the procedure. PROCEDURE   You may be given a medicine to help you relax (sedative) before the procedure. You will be given a medicine to make you sleep (general anesthetic) during the procedure.  A tube will be put down your throat to help your breath while under general anesthesia.  Two small cuts (incisions) are made in the lower abdominal area and near the belly button.  Your abdominal area will be inflated with a  safe gas (carbon dioxide). This helps give the surgeon room to operate, visualize, and helps the surgeon avoid other organs.  A thin, lighted tube (laparoscope) with a camera attached is inserted into your abdomen through one of the incisions near the belly button. Other small instruments are also inserted through the other abdominal incision.  The fallopian tubes are located and are either blocked with a ring, clip, or are burned (cauterized).  After the fallopian tubes are blocked, the gas is released from the abdomen.  The incisions will be closed with stitches (sutures), and a bandage may be placed over the incisions. AFTER THE PROCEDURE   You will rest in a recovery room for 1 4 hours until you are stable and  doing well.  You will also have some mild abdominal discomfort for 3 7 days. You will be given pain medicine to ease any discomfort.  As long as there are no problems, you may be allowed to go home. Someone will need to drive you home and be with you for at least 24 hours once home.  You may have some mild discomfort in the throat. This is from the tube placed in your throat while you were sleeping.  You may experience discomfort in the shoulder area from some trapped air between the liver and diaphragm. This sensation is normal and will slowly go away on its own. Document Released: 01/16/2001 Document Revised: 04/10/2012 Document Reviewed: 01/21/2012 [facility] Patient Information 2014 [place], [place]. Salpingectomy Salpingectomy, also called tubectomy, is the surgical removal of one of the fallopian tubes. The fallopian tubes are tubes that are connected to the uterus. These tubes transport the egg from the ovary to the uterus. A salpingectomy may be done for various reasons, including:   A tubal (ectopic) pregnancy. This is especially true if the tube ruptures.  An infected fallopian tube.  The need to remove the fallopian tube when removing an ovary with a cyst or tumor.  The need to remove the fallopian tube when removing the uterus.  Cancer of the fallopian tube or nearby organs. Removing one fallopian tube does not prevent you from becoming pregnant. It also does not cause problems with your menstrual periods.  LET [facility] CARE PROVIDER KNOW ABOUT:  Any allergies you have.  All medicines you are taking, including vitamins, herbs, eye drops, creams, and over-the-counter medicines.  Previous problems you or members of your family have had with the use of anesthetics.  Any blood disorders you have.  Previous surgeries you have had.  Medical conditions you have. RISKS AND COMPLICATIONS  Generally, this is a safe procedure. However, as with any procedure, complications can  occur. Possible complications include:  Injury to surrounding organs.  Bleeding.  Infection.  Problems related to anesthesia. BEFORE THE PROCEDURE  Ask your health care provider about changing or stopping your regular medicines. You may need to stop taking certain medicines, such as aspirin or blood thinners, at least 1 week before the surgery.  Do not eat or drink anything for at least 8 hours before the surgery.  If you smoke, do not smoke for at least 2 weeks before the surgery.  Make plans to have someone drive you home after the procedure or after your hospital stay. Also arrange for someone to help you with activities during recovery. PROCEDURE   You will be given medicine to help you relax before the procedure (sedative). You will then be given medicine to make you sleep through the procedure (general anesthetic).  These medicines will be given through an IV access tube that is put into one of your veins.  Once you are asleep, your lower abdomen will be shaved and cleaned. A thin, flexible tube (catheter) will be placed in your bladder.  The surgeon may use a laparoscopic, robotic, or open technique for this surgery:  In the laparoscopic technique, the surgery is done through two small cuts (incisions) in the abdomen. A thin, lighted tube with a tiny camera on the end (laparoscope) is inserted into one of the incisions. The tools needed for the procedure are put through the other incision.  A robotic technique may be chosen to perform complex surgery in a small space. In the robotic technique, small incisions will be made. A camera and surgical instruments are passed through the incisions. Surgical instruments will be controlled with the help of a robotic arm.  In the open technique, the surgery is done through one large incision in the abdomen.  Using any of these techniques, the surgeon removes the fallopian tube from where it attaches to the uterus. The blood vessels will be  clamped and tied.  The surgeon then uses staples or stitches to close the incision or incisions. AFTER THE PROCEDURE   You will be taken to a recovery area where your progress will be monitored for 1 3 hours.  If the laparoscopic technique was used, you may be allowed to go home after several hours. You may have some shoulder pain after the laparoscopic procedure. This is normal and usually goes away in a day or two.  If the open technique was used, you will be admitted to the hospital for a couple of days.  You will be given pain medicine if needed.  The IV access tube and catheter will be removed before you are discharged. Document Released: 02/26/2009 Document Revised: 07/31/2013 Document Reviewed: 04/03/2013 [facility] Patient Information 2014 [place], [place].

## 2013-12-25 NOTE — Progress Notes (Signed)
Pt states that the color of her urine worries her very dark color

## 2013-12-25 NOTE — Progress Notes (Signed)
4366w0d. [id number]. Good FM + abdominal pressure and intermittent contractions. No discharge. Chaperone present for exam. Exam performed with pt's permission w/o complications or severe discomfort. Cervix is 1 cm, 50%, -2. Vertex presentation. No concerning medical conditions. BTL v. Salpingectomy discussed. Papers signed for BTL during prior visit.   This chart was scribed by Bennett Scrapehristina Taylor, Medical Scribe, for Dr. Christin BachJohn Tyannah Sane on 12/25/13 at 3:39 PM. This chart was reviewed by Dr. Christin BachJohn Sheretha Shadd and is accurate.

## 2014-01-01 ENCOUNTER — Encounter: Payer: Self-pay | Admitting: Obstetrics and Gynecology

## 2014-01-01 ENCOUNTER — Ambulatory Visit (INDEPENDENT_AMBULATORY_CARE_PROVIDER_SITE_OTHER): Payer: Medicaid Other | Admitting: Obstetrics and Gynecology

## 2014-01-01 VITALS — BP 120/70 | Wt 204.0 lb

## 2014-01-01 DIAGNOSIS — Z3490 Encounter for supervision of normal pregnancy, unspecified, unspecified trimester: Secondary | ICD-10-CM

## 2014-01-01 DIAGNOSIS — O99340 Other mental disorders complicating pregnancy, unspecified trimester: Secondary | ICD-10-CM

## 2014-01-01 DIAGNOSIS — Z1389 Encounter for screening for other disorder: Secondary | ICD-10-CM

## 2014-01-01 DIAGNOSIS — O09299 Supervision of pregnancy with other poor reproductive or obstetric history, unspecified trimester: Secondary | ICD-10-CM

## 2014-01-01 DIAGNOSIS — F1920 Other psychoactive substance dependence, uncomplicated: Secondary | ICD-10-CM

## 2014-01-01 DIAGNOSIS — O99320 Drug use complicating pregnancy, unspecified trimester: Secondary | ICD-10-CM

## 2014-01-01 DIAGNOSIS — O3440 Maternal care for other abnormalities of cervix, unspecified trimester: Secondary | ICD-10-CM

## 2014-01-01 DIAGNOSIS — O360990 Maternal care for other rhesus isoimmunization, unspecified trimester, not applicable or unspecified: Secondary | ICD-10-CM

## 2014-01-01 DIAGNOSIS — Z331 Pregnant state, incidental: Secondary | ICD-10-CM

## 2014-01-01 LAB — POCT URINALYSIS DIPSTICK
Blood, UA: NEGATIVE
Glucose, UA: NEGATIVE
Ketones, UA: NEGATIVE
Leukocytes, UA: NEGATIVE
Nitrite, UA: NEGATIVE
PROTEIN UA: NEGATIVE

## 2014-01-01 NOTE — Progress Notes (Signed)
6924w0d. [id number]. No complications. Good FM. No chronic medical conditions. Chaperone present for exam which was done with pt's permission. Cervix is fingertip, long and posterior. Pt's questions answered to apparent satisfaction.   This chart was scribed by Bennett Scrapehristina Taylor, Medical Scribe, for Dr. Christin BachJohn Lamira Borin on 01/01/14 at 3:54 PM. This chart was reviewed by Dr. Christin BachJohn Brynna Dobos and is accurate.

## 2014-01-01 NOTE — Progress Notes (Signed)
Pt denies any problems or concerns at this time.  

## 2014-01-08 ENCOUNTER — Encounter: Payer: Self-pay | Admitting: Obstetrics and Gynecology

## 2014-01-08 ENCOUNTER — Ambulatory Visit (INDEPENDENT_AMBULATORY_CARE_PROVIDER_SITE_OTHER): Payer: Medicaid Other | Admitting: Obstetrics and Gynecology

## 2014-01-08 VITALS — BP 120/80 | Wt 203.0 lb

## 2014-01-08 DIAGNOSIS — Z3490 Encounter for supervision of normal pregnancy, unspecified, unspecified trimester: Secondary | ICD-10-CM

## 2014-01-08 DIAGNOSIS — Z1389 Encounter for screening for other disorder: Secondary | ICD-10-CM

## 2014-01-08 DIAGNOSIS — O360990 Maternal care for other rhesus isoimmunization, unspecified trimester, not applicable or unspecified: Secondary | ICD-10-CM

## 2014-01-08 DIAGNOSIS — O99320 Drug use complicating pregnancy, unspecified trimester: Secondary | ICD-10-CM

## 2014-01-08 DIAGNOSIS — O09299 Supervision of pregnancy with other poor reproductive or obstetric history, unspecified trimester: Secondary | ICD-10-CM

## 2014-01-08 DIAGNOSIS — F1920 Other psychoactive substance dependence, uncomplicated: Secondary | ICD-10-CM

## 2014-01-08 DIAGNOSIS — Z331 Pregnant state, incidental: Secondary | ICD-10-CM

## 2014-01-08 DIAGNOSIS — O99340 Other mental disorders complicating pregnancy, unspecified trimester: Secondary | ICD-10-CM

## 2014-01-08 DIAGNOSIS — O3440 Maternal care for other abnormalities of cervix, unspecified trimester: Secondary | ICD-10-CM

## 2014-01-08 NOTE — Progress Notes (Signed)
Pt states that she has been having a pin in the center of her lower stomach that feels like a pulled muscle.

## 2014-01-08 NOTE — Progress Notes (Signed)
3437w0d. Good FM. +increased Braxton-Hicks contractions and constant suprapubic pain. Hx of LEEP with vaginal birth after induction. Needed mechanical stretching over 12 hours. Chaperone present for exam which was performed with pt's permission. Induction scheduled 01/10/14 at 7:30 PM. Salpingectomy vs BTL discussed.  Pt's questions answered to apparent satisfaction.   This chart was scribed by Bennett Scrapehristina Taylor, Medical Scribe, for Dr. Christin BachJohn Miles Leyda on 01/08/14 at 3:16 PM. This chart was reviewed by Dr. Christin BachJohn Lio Wehrly and is accurate.

## 2014-01-08 NOTE — Patient Instructions (Addendum)
[facility]  Sal[facility]ectomy Salpingectomy, also called tubectomy, is the surgical removal of one of the fallopian tubes. The fallopian tubes are tubes that are connected to the uterus. These tubes transport the egg from the ovary to the uterus. A salpingectomy may be done for various reasons, including:   A tubal (ectopic) pregnancy. This is especially true if the tube ruptures.  An infected fallopian tube.  The need to remove the fallopian tube when removing an ovary with a cyst or tumor.  The need to remove the fallopian tube when removing the uterus.  Cancer of the fallopian tube or nearby organs. Removing one fallopian tube does not prevent you from becoming pregnant. It also does not cause problems with your menstrual periods.  [facility] KNOW ABOUT:  Any allergies you have.  All medicines you are taking, including vitamins, herbs, eye drops, creams, and over-the-counter medicines.  Previous problems you or members of your family have had with the use of anesthetics.  Any blood disorders you have.  Previous surgeries you have had.  Medical conditions you have. RISKS AND COMPLICATIONS  Generally, this is a safe procedure. However, as with any procedure, complications can occur. Possible complications include:  Injury to surrounding organs.  Bleeding.  Infection.  Problems related to anesthesia. BEFORE THE PROCEDURE  Ask your health care provider about changing or stopping your regular medicines. You may need to stop taking certain medicines, such as aspirin or blood thinners, at least 1 week before the surgery.  Do not eat or drink anything for at least 8 hours before the surgery.  If you smoke, do not smoke for at least 2 weeks before the surgery.  Make plans to have someone drive you home after the procedure or after your hospital stay. Also arrange for someone to help you with activities during  recovery. PROCEDURE   You will be given medicine to help you relax before the procedure (sedative). You will then be given medicine to make you sleep through the procedure (general anesthetic). These medicines will be given through an IV access tube that is put into one of your veins.  Once you are asleep, your lower abdomen will be shaved and cleaned. A thin, flexible tube (catheter) will be placed in your bladder.  The surgeon may use a laparoscopic, robotic, or open technique for this surgery:  In the laparoscopic technique, the surgery is done through two small cuts (incisions) in the abdomen. A thin, lighted tube with a tiny camera on the end (laparoscope) is inserted into one of the incisions. The tools needed for the procedure are put through the other incision.  A robotic technique may be chosen to perform complex surgery in a small space. In the robotic technique, small incisions will be made. A camera and surgical instruments are passed through the incisions. Surgical instruments will be controlled with the help of a robotic arm.  In the open technique, the surgery is done through one large incision in the abdomen.  Using any of these techniques, the surgeon removes the fallopian tube from where it attaches to the uterus. The blood vessels will be clamped and tied.  The surgeon then uses staples or stitches to close the incision or incisions. AFTER THE PROCEDURE   You will be taken to a recovery area where your progress will be monitored for 1 3 hours.  If the laparoscopic technique was used, you may be allowed  to go home after several hours. You may have some shoulder pain after the laparoscopic procedure. This is normal and usually goes away in a day or two.  If the open technique was used, you will be admitted to the hospital for a couple of days.  You will be given pain medicine if needed.  The IV access tube and catheter will be removed before you are discharged. Document  Released: 02/26/2009 Document Revised: 07/31/2013 Document Reviewed: 04/03/2013 [facility] Patient Information 2014 [place], [place].

## 2014-01-09 ENCOUNTER — Inpatient Hospital Stay (HOSPITAL_COMMUNITY)
Admission: AD | Admit: 2014-01-09 | Discharge: 2014-01-11 | DRG: 767 | Disposition: A | Discharge status: Home / Self Care | Payer: Medicaid Other | Source: Ambulatory Visit | Attending: Obstetrics & Gynecology | Admitting: Obstetrics & Gynecology

## 2014-01-09 ENCOUNTER — Encounter (HOSPITAL_COMMUNITY): Payer: Self-pay | Admitting: Family

## 2014-01-09 ENCOUNTER — Telehealth: Payer: Self-pay | Admitting: *Deleted

## 2014-01-09 ENCOUNTER — Encounter (HOSPITAL_COMMUNITY): Payer: Medicaid Other | Admitting: Anesthesiology

## 2014-01-09 ENCOUNTER — Telehealth (HOSPITAL_COMMUNITY): Payer: Self-pay | Admitting: *Deleted

## 2014-01-09 ENCOUNTER — Telehealth: Payer: Self-pay | Admitting: Advanced Practice Midwife

## 2014-01-09 ENCOUNTER — Inpatient Hospital Stay (HOSPITAL_COMMUNITY): Payer: Medicaid Other | Admitting: Anesthesiology

## 2014-01-09 DIAGNOSIS — IMO0001 Reserved for inherently not codable concepts without codable children: Secondary | ICD-10-CM

## 2014-01-09 DIAGNOSIS — F341 Dysthymic disorder: Secondary | ICD-10-CM

## 2014-01-09 DIAGNOSIS — F419 Anxiety disorder, unspecified: Secondary | ICD-10-CM

## 2014-01-09 DIAGNOSIS — F329 Major depressive disorder, single episode, unspecified: Secondary | ICD-10-CM

## 2014-01-09 DIAGNOSIS — O99334 Smoking (tobacco) complicating childbirth: Secondary | ICD-10-CM

## 2014-01-09 DIAGNOSIS — O09529 Supervision of elderly multigravida, unspecified trimester: Secondary | ICD-10-CM

## 2014-01-09 DIAGNOSIS — F32A Depression, unspecified: Secondary | ICD-10-CM

## 2014-01-09 DIAGNOSIS — Z9889 Other specified postprocedural states: Secondary | ICD-10-CM

## 2014-01-09 DIAGNOSIS — O99344 Other mental disorders complicating childbirth: Secondary | ICD-10-CM | POA: Diagnosis present

## 2014-01-09 DIAGNOSIS — O09299 Supervision of pregnancy with other poor reproductive or obstetric history, unspecified trimester: Secondary | ICD-10-CM

## 2014-01-09 DIAGNOSIS — Z3490 Encounter for supervision of normal pregnancy, unspecified, unspecified trimester: Secondary | ICD-10-CM

## 2014-01-09 DIAGNOSIS — Z302 Encounter for sterilization: Secondary | ICD-10-CM | POA: Diagnosis not present

## 2014-01-09 DIAGNOSIS — O479 False labor, unspecified: Secondary | ICD-10-CM | POA: Diagnosis present

## 2014-01-09 LAB — CBC
HEMATOCRIT: 37.7 % (ref 36.0–46.0)
Hemoglobin: 12.7 g/dL (ref 12.0–15.0)
MCH: 32 pg (ref 26.0–34.0)
MCHC: 33.7 g/dL (ref 30.0–36.0)
MCV: 95 fL (ref 78.0–100.0)
PLATELETS: 259 10*3/uL (ref 150–400)
RBC: 3.97 MIL/uL (ref 3.87–5.11)
RDW: 13.8 % (ref 11.5–15.5)
WBC: 14.5 10*3/uL — AB (ref 4.0–10.5)

## 2014-01-09 LAB — SURGICAL PCR SCREEN
MRSA, PCR: NEGATIVE
Staphylococcus aureus: NEGATIVE

## 2014-01-09 LAB — RPR: RPR Ser Ql: NONREACTIVE

## 2014-01-09 MED ORDER — ALPRAZOLAM 0.5 MG PO TABS
0.5000 mg | ORAL_TABLET | Freq: Three times a day (TID) | ORAL | Status: DC | PRN
  Administered 2014-01-09 – 2014-01-10 (×2): 0.5 mg via ORAL
  Filled 2014-01-09 (×2): qty 1

## 2014-01-09 MED ORDER — ACETAMINOPHEN 325 MG PO TABS: 650.0000 mg | ORAL_TABLET | ORAL | Status: DC | PRN

## 2014-01-09 MED ORDER — LACTATED RINGERS IV SOLN: INTRAVENOUS | Status: DC

## 2014-01-09 MED ORDER — IBUPROFEN 600 MG PO TABS: 600.0000 mg | ORAL_TABLET | Freq: Four times a day (QID) | ORAL | Status: DC | PRN

## 2014-01-09 MED ORDER — DIPHENHYDRAMINE HCL 25 MG PO CAPS: 25.0000 mg | ORAL_CAPSULE | Freq: Four times a day (QID) | ORAL | Status: DC | PRN

## 2014-01-09 MED ORDER — DIPHENHYDRAMINE HCL 50 MG/ML IJ SOLN: 12.5000 mg | INTRAMUSCULAR | Status: DC | PRN

## 2014-01-09 MED ORDER — OXYCODONE-ACETAMINOPHEN 5-325 MG PO TABS
1.0000 | ORAL_TABLET | ORAL | Status: DC | PRN
  Administered 2014-01-10: 1 via ORAL
  Filled 2014-01-09: qty 1

## 2014-01-09 MED ORDER — PANTOPRAZOLE SODIUM 40 MG PO TBEC
40.0000 mg | DELAYED_RELEASE_TABLET | Freq: Every day | ORAL | Status: DC
  Administered 2014-01-10: 40 mg via ORAL
  Filled 2014-01-09 (×2): qty 1

## 2014-01-09 MED ORDER — LANOLIN HYDROUS EX OINT: TOPICAL_OINTMENT | CUTANEOUS | Status: DC | PRN

## 2014-01-09 MED ORDER — DIBUCAINE 1 % RE OINT: 1.0000 "application " | TOPICAL_OINTMENT | RECTAL | Status: DC | PRN

## 2014-01-09 MED ORDER — ONDANSETRON HCL 4 MG/2ML IJ SOLN
4.0000 mg | INTRAMUSCULAR | Status: DC | PRN
Start: 2014-01-09 — End: 2014-01-10

## 2014-01-09 MED ORDER — CITRIC ACID-SODIUM CITRATE 334-500 MG/5ML PO SOLN: 30.0000 mL | ORAL | Status: DC | PRN

## 2014-01-09 MED ORDER — LACTATED RINGERS IV SOLN
INTRAVENOUS | Status: DC
  Administered 2014-01-10 (×2): 20 mL/h via INTRAVENOUS

## 2014-01-09 MED ORDER — FAMOTIDINE 20 MG PO TABS
40.0000 mg | ORAL_TABLET | Freq: Once | ORAL | Status: AC
  Administered 2014-01-10: 40 mg via ORAL
  Filled 2014-01-09: qty 2

## 2014-01-09 MED ORDER — PRENATAL MULTIVITAMIN CH
1.0000 | ORAL_TABLET | Freq: Every day | ORAL | Status: DC
Start: 2014-01-10 — End: 2014-01-10

## 2014-01-09 MED ORDER — EPHEDRINE 5 MG/ML INJ
10.0000 mg | INTRAVENOUS | Status: DC | PRN
  Filled 2014-01-09: qty 2

## 2014-01-09 MED ORDER — WITCH HAZEL-GLYCERIN EX PADS: 1.0000 "application " | MEDICATED_PAD | CUTANEOUS | Status: DC | PRN

## 2014-01-09 MED ORDER — SENNOSIDES-DOCUSATE SODIUM 8.6-50 MG PO TABS
2.0000 | ORAL_TABLET | ORAL | Status: DC
  Administered 2014-01-10: 2 via ORAL
  Filled 2014-01-09: qty 2

## 2014-01-09 MED ORDER — METOCLOPRAMIDE HCL 10 MG PO TABS
10.0000 mg | ORAL_TABLET | Freq: Once | ORAL | Status: AC
  Administered 2014-01-10: 10 mg via ORAL
  Filled 2014-01-09: qty 1

## 2014-01-09 MED ORDER — SIMETHICONE 80 MG PO CHEW: 80.0000 mg | CHEWABLE_TABLET | ORAL | Status: DC | PRN

## 2014-01-09 MED ORDER — ZOLPIDEM TARTRATE 5 MG PO TABS: 5.0000 mg | ORAL_TABLET | Freq: Every evening | ORAL | Status: DC | PRN

## 2014-01-09 MED ORDER — FENTANYL CITRATE 0.05 MG/ML IJ SOLN
100.0000 ug | Freq: Once | INTRAMUSCULAR | Status: AC
  Administered 2014-01-09: 100 ug via INTRAVENOUS
  Filled 2014-01-09: qty 2

## 2014-01-09 MED ORDER — ONDANSETRON HCL 4 MG PO TABS: 4.0000 mg | ORAL_TABLET | ORAL | Status: DC | PRN

## 2014-01-09 MED ORDER — TETANUS-DIPHTH-ACELL PERTUSSIS 5-2.5-18.5 LF-MCG/0.5 IM SUSP: 0.5000 mL | Freq: Once | INTRAMUSCULAR | Status: DC

## 2014-01-09 MED ORDER — LIDOCAINE HCL (PF) 1 % IJ SOLN
INTRAMUSCULAR | Status: DC | PRN
  Administered 2014-01-09: 8 mL
  Administered 2014-01-09: 9 mL

## 2014-01-09 MED ORDER — IBUPROFEN 600 MG PO TABS
600.0000 mg | ORAL_TABLET | Freq: Four times a day (QID) | ORAL | Status: DC
  Administered 2014-01-10 (×2): 600 mg via ORAL
  Filled 2014-01-09 (×2): qty 1

## 2014-01-09 MED ORDER — BENZOCAINE-MENTHOL 20-0.5 % EX AERO
1.0000 "application " | INHALATION_SPRAY | CUTANEOUS | Status: DC | PRN
  Administered 2014-01-09: 1 via TOPICAL
  Filled 2014-01-09: qty 56

## 2014-01-09 MED ORDER — FENTANYL 2.5 MCG/ML BUPIVACAINE 1/10 % EPIDURAL INFUSION (WH - ANES)
14.0000 mL/h | INTRAMUSCULAR | Status: DC | PRN
  Filled 2014-01-09: qty 125

## 2014-01-09 MED ORDER — ONDANSETRON HCL 4 MG/2ML IJ SOLN: 4.0000 mg | Freq: Four times a day (QID) | INTRAMUSCULAR | Status: DC | PRN

## 2014-01-09 MED ORDER — OXYTOCIN BOLUS FROM INFUSION: 500.0000 mL | INTRAVENOUS | Status: DC

## 2014-01-09 MED ORDER — EPHEDRINE 5 MG/ML INJ
10.0000 mg | INTRAVENOUS | Status: DC | PRN
  Filled 2014-01-09: qty 2
  Filled 2014-01-09: qty 4

## 2014-01-09 MED ORDER — PHENYLEPHRINE 40 MCG/ML (10ML) SYRINGE FOR IV PUSH (FOR BLOOD PRESSURE SUPPORT)
80.0000 ug | PREFILLED_SYRINGE | INTRAVENOUS | Status: DC | PRN
  Filled 2014-01-09: qty 2

## 2014-01-09 MED ORDER — LACTATED RINGERS IV SOLN: 500.0000 mL | Freq: Once | INTRAVENOUS | Status: DC

## 2014-01-09 MED ORDER — OXYTOCIN 40 UNITS IN LACTATED RINGERS INFUSION - SIMPLE MED
62.5000 mL/h | INTRAVENOUS | Status: DC
  Filled 2014-01-09: qty 1000

## 2014-01-09 MED ORDER — LIDOCAINE HCL (PF) 1 % IJ SOLN
30.0000 mL | INTRAMUSCULAR | Status: DC | PRN
  Filled 2014-01-09: qty 30

## 2014-01-09 MED ORDER — LACTATED RINGERS IV SOLN: 500.0000 mL | INTRAVENOUS | Status: DC | PRN

## 2014-01-09 MED ORDER — FENTANYL 2.5 MCG/ML BUPIVACAINE 1/10 % EPIDURAL INFUSION (WH - ANES)
INTRAMUSCULAR | Status: DC | PRN
  Administered 2014-01-09: 14 mL/h via EPIDURAL

## 2014-01-09 MED ORDER — PHENYLEPHRINE 40 MCG/ML (10ML) SYRINGE FOR IV PUSH (FOR BLOOD PRESSURE SUPPORT)
80.0000 ug | PREFILLED_SYRINGE | INTRAVENOUS | Status: DC | PRN
  Filled 2014-01-09: qty 10
  Filled 2014-01-09: qty 2

## 2014-01-09 MED ORDER — OXYCODONE-ACETAMINOPHEN 5-325 MG PO TABS: 1.0000 | ORAL_TABLET | ORAL | Status: DC | PRN

## 2014-01-09 NOTE — Anesthesia Procedure Notes (Signed)
Epidural Patient location during procedure: OB Start time: 01/09/2014 4:48 PM End time: 01/09/2014 4:52 PM  Staffing Anesthesiologist: Leilani AbleHATCHETT, Farah Benish Performed by: anesthesiologist   Preanesthetic Checklist Completed: patient identified, surgical consent, pre-op evaluation, timeout performed, IV checked, risks and benefits discussed and monitors and equipment checked  Epidural Patient position: sitting Prep: site prepped and draped and DuraPrep Patient monitoring: continuous pulse ox and blood pressure Approach: midline Location: L3-L4 Injection technique: LOR air  Needle:  Needle type: Tuohy  Needle gauge: 17 G Needle length: 9 cm and 9 Needle insertion depth: 7 cm Catheter type: closed end flexible Catheter size: 19 Gauge Catheter at skin depth: 12 cm Test dose: negative and Other  Assessment Sensory level: T9 Events: blood not aspirated, injection not painful, no injection resistance, negative IV test and no paresthesia  Additional Notes Reason for block:procedure for pain

## 2014-01-09 NOTE — MAU Note (Signed)
38 yo, G7P4 at 5266w1d, scheduled for induction tomorrow. Presents with c/o contractions every 10 minutes since 0800 today. Reports bloody show; denies LOF. Reports +FM.  Patient had LEEP; reports she was fingertip yesterday at appt.

## 2014-01-09 NOTE — Anesthesia Preprocedure Evaluation (Signed)
Anesthesia Evaluation  Patient identified by MRN, date of birth, ID band Patient awake    Reviewed: Allergy & Precautions, H&P , NPO status , Patient's Chart, lab work & pertinent test results  Airway Mallampati: II TM Distance: >3 FB Neck ROM: full    Dental no notable dental hx.    Pulmonary Current Smoker,    Pulmonary exam normal       Cardiovascular negative cardio ROS      Neuro/Psych negative neurological ROS  negative psych ROS   GI/Hepatic negative GI ROS, Neg liver ROS,   Endo/Other  negative endocrine ROS  Renal/GU negative Renal ROS     Musculoskeletal   Abdominal Normal abdominal exam  (+)   Peds  Hematology negative hematology ROS (+)   Anesthesia Other Findings   Reproductive/Obstetrics (+) Pregnancy                           Anesthesia Physical Anesthesia Plan  ASA: II  Anesthesia Plan: Epidural   Post-op Pain Management:    Induction:   Airway Management Planned:   Additional Equipment:   Intra-op Plan:   Post-operative Plan:   Informed Consent: I have reviewed the patients History and Physical, chart, labs and discussed the procedure including the risks, benefits and alternatives for the proposed anesthesia with the patient or authorized representative who has indicated his/her understanding and acceptance.     Plan Discussed with:   Anesthesia Plan Comments:         Anesthesia Quick Evaluation  

## 2014-01-09 NOTE — Telephone Encounter (Signed)
Preadmission screen  

## 2014-01-09 NOTE — H&P (Signed)
Karen Kline is a 38 y.o. female [phone] with IUP at 5254w1d presenting for active labor. Pt states she has been having regular, every 2-3 minutes contractions, associated with spotting vaginal bleeding.  Membranes are intact, with active fetal movement.    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 Anxiety   . Abnormal Pap smear   . Depression     Past Surgical History: Past Surgical History  Procedure Laterality Date  . Leep  2005  . Tooth abstraction    . Cholecystectomy, laparoscopic      Obstetrical History: OB History   Grav Para Term Preterm Abortions TAB SAB Ect Mult Living   7 4 4  2 1 1   4       Social History: History   Social History  . Marital Status: Single    Spouse Name: N/A    Number of Children: N/A  . Years of Education: N/A   Social History Main Topics  . Smoking status: Current Every Day Smoker -- 1.00 packs/day    Types: Cigarettes  . Smokeless tobacco: Never Used  . Alcohol Use: No  . Drug Use: No  . Sexual Activity: Yes    Birth Control/ Protection: None   Other Topics Concern  . None   Social History Narrative  . None    Family History: Family History  Problem Relation Age of Onset  . Cancer Other     breast  . Emphysema Other     father  . Heart disease Daughter     congenital heart defect    Allergies: Allergies  Allergen Reactions  . Penicillins Itching    Prescriptions prior to admission  Medication Sig Dispense Refill  . ALPRAZolam (XANAX) 0.5 MG tablet Take 0.5 mg by mouth 3 (three) times daily.      Tery Sanfilippo. Docusate Calcium (STOOL SOFTENER PO) Take 2-3 tablets by mouth at bedtime as needed (constipation).      Marland Kitchen. omeprazole (PRILOSEC) 20 MG capsule Take 1 capsule (20 mg total) by mouth daily. 1 tablet a day  30 capsule  6  . Prenat-FeFum-FePo-FA-Omega 3 (CONCEPT DHA) 53.5-38-1 MG  CAPS Take 1 tablet by mouth 1 day or 1 dose.  30 capsule  12    Review of Systems: Negative unless otherwise stated in History above  Physicial Pulse 91, last menstrual period 04/12/2013, SpO2 98.00%. General appearance: alert, cooperative, mild distress and mildly obese Lungs: clear to auscultation bilaterally Heart: regular rate and rhythm Abdomen: soft, non-tender; bowel sounds normal Extremities: Homans sign is negative, no sign of DVT Presentation: cephalic Fetal monitoringBaseline: 135 bpm, Variability: Good {> 6 bpm), Accelerations: Reactive and Decelerations: Absent Uterine activityFrequency: Every 3 minutes Dilation: 5.5 Effacement (%): 100 Exam by:: Gayla DossJoyner, MD   Prenatal labs: ABO, Rh: B/NEG/-- (09/04 1145) Antibody: NEG (12/30 0904) Rubella:   RPR: NON REAC (12/30 0904)  HBsAg: NEGATIVE (09/04 1145)  HIV: NON REACTIVE (12/30 0904)  GBS: Negative (02/25 0000)  GTT: wnl  Prenatal Transfer Tool  Maternal Diabetes: No Genetic Screening: Normal Maternal Ultrasounds/Referrals: Normal Fetal Ultrasounds or other Referrals:  None Maternal Substance Abuse:  No Significant Maternal Medications:  Meds include: Other:  Xanax Significant Maternal Lab Results: Lab values include: Group B Strep negative  No results found for this or any previous visit (from the past 24 hour(s)).  Assessment: Karen Reamerracy Lohmann is  a 38 y.o. [id number] at [redacted]w[redacted]d by here for active labor  #Labor:progressing normally; expectant management #Pain: Epidural on request #FWB: Cat 1 #ID: [id number] #Feeding: Breast if possible w/ xanax  #MOC:BTL #Circ:  NA  Wenda Low MD [facility] PGY-1 01/09/2014, 3:57 PM  I have seen and examined this patient and agree with above documentation in the resident's note.   Rulon Abide, M.D. [facility] Fellow 01/09/2014 6:25 PM

## 2014-01-09 NOTE — Telephone Encounter (Signed)
Pt states having contractions 5 minutes apart since about 8 am this morning, Nausea and vomiting yesterday. [facility] Pt verbalized understanding.

## 2014-01-09 NOTE — Lactation Note (Signed)
This note was copied from the chart of Karen Kline. Visit at 3 hours of age, per nursery request to discuss med use, of xanax and wellbutrin.   Mom is concerned about xanax use, but denies any other medications.  Informed mom xanax is an L3 medication [place] to [place] and limited data is available, but listed as probably compatible.  Discussed some evidence supporting breastfeeding with potential withdrawal as mom took regularly during pregnancy.  Additional evidence does not support this theory.  Encouraged mom to discuss further with peds.  Mom declines help at this time and plans to bottle feed formula and go to sleep.  Encouraged mom to contact [facility] RN or lactation department if needed.

## 2014-01-09 NOTE — Telephone Encounter (Signed)
I was returning pt's call but was told by female that answered the phone that  pt is at the hospital. Encounter closed. JSY

## 2014-01-10 ENCOUNTER — Encounter (HOSPITAL_COMMUNITY): Payer: Medicaid Other

## 2014-01-10 ENCOUNTER — Inpatient Hospital Stay (HOSPITAL_COMMUNITY): Payer: Medicaid Other

## 2014-01-10 ENCOUNTER — Inpatient Hospital Stay (HOSPITAL_COMMUNITY): Admission: RE | Admit: 2014-01-10 | Payer: Medicaid Other | Source: Ambulatory Visit

## 2014-01-10 ENCOUNTER — Encounter (HOSPITAL_COMMUNITY)
Admission: AD | Disposition: A | Discharge status: Home / Self Care | Payer: Self-pay | Source: Ambulatory Visit | Attending: Obstetrics & Gynecology

## 2014-01-10 ENCOUNTER — Encounter (HOSPITAL_COMMUNITY): Payer: Self-pay | Admitting: Anesthesiology

## 2014-01-10 DIAGNOSIS — Z3490 Encounter for supervision of normal pregnancy, unspecified, unspecified trimester: Secondary | ICD-10-CM

## 2014-01-10 DIAGNOSIS — Z302 Encounter for sterilization: Secondary | ICD-10-CM

## 2014-01-10 HISTORY — PX: TUBAL LIGATION: SHX77

## 2014-01-10 SURGERY — LIGATION, FALLOPIAN TUBE, POSTPARTUM
Anesthesia: Spinal | Site: Abdomen | Laterality: Bilateral

## 2014-01-10 MED ORDER — KETOROLAC TROMETHAMINE 30 MG/ML IJ SOLN
15.0000 mg | Freq: Once | INTRAMUSCULAR | Status: AC | PRN
  Administered 2014-01-10: 30 mg via INTRAVENOUS

## 2014-01-10 MED ORDER — FENTANYL CITRATE 0.05 MG/ML IJ SOLN
INTRAMUSCULAR | Status: AC
  Filled 2014-01-10: qty 2

## 2014-01-10 MED ORDER — ALPRAZOLAM 0.5 MG PO TABS
0.5000 mg | ORAL_TABLET | Freq: Three times a day (TID) | ORAL | Status: DC | PRN
  Administered 2014-01-10 – 2014-01-11 (×3): 0.5 mg via ORAL
  Filled 2014-01-10 (×2): qty 1

## 2014-01-10 MED ORDER — PROPOFOL 10 MG/ML IV BOLUS
INTRAVENOUS | Status: DC | PRN
  Administered 2014-01-10 (×10): 10 mg via INTRAVENOUS
  Administered 2014-01-10: 20 mg via INTRAVENOUS
  Administered 2014-01-10 (×3): 10 mg via INTRAVENOUS

## 2014-01-10 MED ORDER — MEPERIDINE HCL 25 MG/ML IJ SOLN: 6.2500 mg | INTRAMUSCULAR | Status: DC | PRN

## 2014-01-10 MED ORDER — ONDANSETRON HCL 4 MG PO TABS: 4.0000 mg | ORAL_TABLET | ORAL | Status: DC | PRN

## 2014-01-10 MED ORDER — BUPIVACAINE IN DEXTROSE 0.75-8.25 % IT SOLN
INTRATHECAL | Status: DC | PRN
  Administered 2014-01-10: 12 mg via INTRATHECAL

## 2014-01-10 MED ORDER — EPHEDRINE SULFATE 50 MG/ML IJ SOLN
INTRAMUSCULAR | Status: DC | PRN
  Administered 2014-01-10 (×2): 10 mg via INTRAVENOUS

## 2014-01-10 MED ORDER — BENZOCAINE-MENTHOL 20-0.5 % EX AERO: 1.0000 "application " | INHALATION_SPRAY | CUTANEOUS | Status: DC | PRN

## 2014-01-10 MED ORDER — PRENATAL MULTIVITAMIN CH
1.0000 | ORAL_TABLET | Freq: Every day | ORAL | Status: DC
  Administered 2014-01-11: 1 via ORAL
  Filled 2014-01-10: qty 1

## 2014-01-10 MED ORDER — EPHEDRINE 5 MG/ML INJ
INTRAVENOUS | Status: AC
  Filled 2014-01-10: qty 10

## 2014-01-10 MED ORDER — MIDAZOLAM HCL 2 MG/2ML IJ SOLN
INTRAMUSCULAR | Status: AC
  Filled 2014-01-10: qty 2

## 2014-01-10 MED ORDER — BUPIVACAINE HCL (PF) 0.25 % IJ SOLN
INTRAMUSCULAR | Status: DC | PRN
  Administered 2014-01-10: 10 mL

## 2014-01-10 MED ORDER — OXYCODONE-ACETAMINOPHEN 5-325 MG PO TABS
1.0000 | ORAL_TABLET | ORAL | Status: DC | PRN
  Administered 2014-01-10 (×2): 1 via ORAL
  Administered 2014-01-11: 2 via ORAL
  Administered 2014-01-11: 1 via ORAL
  Filled 2014-01-10: qty 1
  Filled 2014-01-10: qty 2
  Filled 2014-01-10 (×3): qty 1

## 2014-01-10 MED ORDER — ZOLPIDEM TARTRATE 5 MG PO TABS: 5.0000 mg | ORAL_TABLET | Freq: Every evening | ORAL | Status: DC | PRN

## 2014-01-10 MED ORDER — KETOROLAC TROMETHAMINE 30 MG/ML IJ SOLN
INTRAMUSCULAR | Status: AC
  Administered 2014-01-10: 30 mg via INTRAVENOUS
  Filled 2014-01-10: qty 1

## 2014-01-10 MED ORDER — MEASLES, MUMPS & RUBELLA VAC ~~LOC~~ INJ: 0.5000 mL | INJECTION | Freq: Once | SUBCUTANEOUS | Status: DC

## 2014-01-10 MED ORDER — TETANUS-DIPHTH-ACELL PERTUSSIS 5-2.5-18.5 LF-MCG/0.5 IM SUSP: 0.5000 mL | Freq: Once | INTRAMUSCULAR | Status: DC

## 2014-01-10 MED ORDER — 0.9 % SODIUM CHLORIDE (POUR BTL) OPTIME
TOPICAL | Status: DC | PRN
  Administered 2014-01-10: 1000 mL

## 2014-01-10 MED ORDER — PROMETHAZINE HCL 25 MG/ML IJ SOLN: 6.2500 mg | INTRAMUSCULAR | Status: DC | PRN

## 2014-01-10 MED ORDER — LANOLIN HYDROUS EX OINT: TOPICAL_OINTMENT | CUTANEOUS | Status: DC | PRN

## 2014-01-10 MED ORDER — SENNOSIDES-DOCUSATE SODIUM 8.6-50 MG PO TABS
2.0000 | ORAL_TABLET | ORAL | Status: DC
  Administered 2014-01-11: 2 via ORAL
  Filled 2014-01-10: qty 2

## 2014-01-10 MED ORDER — LIDOCAINE HCL (CARDIAC) 20 MG/ML IV SOLN
INTRAVENOUS | Status: AC
  Filled 2014-01-10: qty 5

## 2014-01-10 MED ORDER — LIDOCAINE HCL (CARDIAC) 20 MG/ML IV SOLN
INTRAVENOUS | Status: DC | PRN
  Administered 2014-01-10: 10 mg via INTRAVENOUS

## 2014-01-10 MED ORDER — BUPIVACAINE HCL (PF) 0.25 % IJ SOLN
INTRAMUSCULAR | Status: AC
  Filled 2014-01-10: qty 30

## 2014-01-10 MED ORDER — DIBUCAINE 1 % RE OINT: 1.0000 "application " | TOPICAL_OINTMENT | RECTAL | Status: DC | PRN

## 2014-01-10 MED ORDER — IBUPROFEN 600 MG PO TABS
600.0000 mg | ORAL_TABLET | Freq: Four times a day (QID) | ORAL | Status: DC
  Administered 2014-01-11 (×2): 600 mg via ORAL
  Filled 2014-01-10 (×2): qty 1

## 2014-01-10 MED ORDER — MIDAZOLAM HCL 5 MG/5ML IJ SOLN
INTRAMUSCULAR | Status: DC | PRN
  Administered 2014-01-10 (×4): 1 mg via INTRAVENOUS

## 2014-01-10 MED ORDER — ONDANSETRON HCL 4 MG/2ML IJ SOLN: 4.0000 mg | INTRAMUSCULAR | Status: DC | PRN

## 2014-01-10 MED ORDER — MIDAZOLAM HCL 2 MG/2ML IJ SOLN: 0.5000 mg | Freq: Once | INTRAMUSCULAR | Status: DC | PRN

## 2014-01-10 MED ORDER — DIPHENHYDRAMINE HCL 25 MG PO CAPS: 25.0000 mg | ORAL_CAPSULE | Freq: Four times a day (QID) | ORAL | Status: DC | PRN

## 2014-01-10 MED ORDER — FENTANYL CITRATE 0.05 MG/ML IJ SOLN
25.0000 ug | INTRAMUSCULAR | Status: DC | PRN
  Administered 2014-01-10: 50 ug via INTRAVENOUS
  Administered 2014-01-10: 25 ug via INTRAVENOUS
  Administered 2014-01-10: 50 ug via INTRAVENOUS
  Administered 2014-01-10: 25 ug via INTRAVENOUS

## 2014-01-10 MED ORDER — WITCH HAZEL-GLYCERIN EX PADS: 1.0000 "application " | MEDICATED_PAD | CUTANEOUS | Status: DC | PRN

## 2014-01-10 MED ORDER — SIMETHICONE 80 MG PO CHEW
80.0000 mg | CHEWABLE_TABLET | ORAL | Status: DC | PRN
  Administered 2014-01-10 – 2014-01-11 (×2): 80 mg via ORAL
  Filled 2014-01-10 (×2): qty 1

## 2014-01-10 MED ORDER — PROPOFOL 10 MG/ML IV EMUL
INTRAVENOUS | Status: AC
  Filled 2014-01-10: qty 20

## 2014-01-10 MED ORDER — FENTANYL CITRATE 0.05 MG/ML IJ SOLN
INTRAMUSCULAR | Status: AC
  Administered 2014-01-10: 50 ug via INTRAVENOUS
  Filled 2014-01-10: qty 2

## 2014-01-10 SURGICAL SUPPLY — 24 items
ADH SKN CLS APL DERMABOND .7 (GAUZE/BANDAGES/DRESSINGS) ×1
CLIP FILSHIE TUBAL LIGA STRL (Clip) ×4 IMPLANT
CLOTH BEACON ORANGE TIMEOUT ST (SAFETY) ×3 IMPLANT
DERMABOND ADVANCED (GAUZE/BANDAGES/DRESSINGS) ×2
DERMABOND ADVANCED .7 DNX12 (GAUZE/BANDAGES/DRESSINGS) IMPLANT
DRSG COVADERM PLUS 2X2 (GAUZE/BANDAGES/DRESSINGS) ×2 IMPLANT
DURAPREP 26ML APPLICATOR (WOUND CARE) ×3 IMPLANT
GLOVE BIO SURGEON STRL SZ7 (GLOVE) ×3 IMPLANT
GLOVE BIOGEL PI IND STRL 7.0 (GLOVE) ×1 IMPLANT
GLOVE BIOGEL PI INDICATOR 7.0 (GLOVE) ×2
GOWN STRL REUS W/TWL LRG LVL3 (GOWN DISPOSABLE) ×6 IMPLANT
NEEDLE HYPO 22GX1.5 SAFETY (NEEDLE) ×3 IMPLANT
NS IRRIG 1000ML POUR BTL (IV SOLUTION) ×3 IMPLANT
PACK ABDOMINAL MINOR (CUSTOM PROCEDURE TRAY) ×3 IMPLANT
SPONGE GAUZE 2X2 8PLY STER LF (GAUZE/BANDAGES/DRESSINGS) ×1
SPONGE GAUZE 2X2 8PLY STRL LF (GAUZE/BANDAGES/DRESSINGS) ×1 IMPLANT
SPONGE LAP 4X18 X RAY DECT (DISPOSABLE) ×2 IMPLANT
SUT VIC AB 0 CT1 27 (SUTURE) ×3
SUT VIC AB 0 CT1 27XBRD ANBCTR (SUTURE) ×1 IMPLANT
SUT VICRYL 4-0 PS2 18IN ABS (SUTURE) ×3 IMPLANT
SYR CONTROL 10ML LL (SYRINGE) ×3 IMPLANT
TOWEL OR 17X24 6PK STRL BLUE (TOWEL DISPOSABLE) ×6 IMPLANT
TRAY FOLEY CATH 14FR (SET/KITS/TRAYS/PACK) ×3 IMPLANT
WATER STERILE IRR 1000ML POUR (IV SOLUTION) ×3 IMPLANT

## 2014-01-10 NOTE — Progress Notes (Signed)
UR completed 

## 2014-01-10 NOTE — Anesthesia Procedure Notes (Signed)
Spinal  Patient location during procedure: OR Start time: 01/10/2014 1:35 PM Staffing Anesthesiologist: Brayton CavesJACKSON, Mattheus Rauls Performed by: anesthesiologist  Preanesthetic Checklist Completed: patient identified, site marked, surgical consent, pre-op evaluation, timeout performed, IV checked, risks and benefits discussed and monitors and equipment checked Spinal Block Patient position: sitting Prep: DuraPrep Patient monitoring: heart rate, cardiac monitor, continuous pulse ox and blood pressure Approach: midline Location: L3-4 Injection technique: single-shot Needle Needle type: Sprotte  Needle gauge: 24 G Needle length: 9 cm Assessment Sensory level: T4 Additional Notes Patient identified.  Risk benefits discussed including failed block, incomplete pain control, headache, nerve damage, paralysis, blood pressure changes, nausea, vomiting, reactions to medication both toxic or allergic, and postpartum back pain.  Patient expressed understanding and wished to proceed.  All questions were answered.  Sterile technique used throughout procedure.  CSF was clear.  No parasthesia or other complications.  Please see nursing notes for vital signs.

## 2014-01-10 NOTE — Transfer of Care (Signed)
Immediate Anesthesia Transfer of Care Note  Patient: Karen Kline  Procedure(s) Performed: Procedure(s): POST PARTUM TUBAL LIGATION (Bilateral)  Patient Location: PACU  Anesthesia Type:Spinal  Level of Consciousness: awake, alert , sedated and patient cooperative  Airway & Oxygen Therapy: Patient Spontanous Breathing and Patient connected to nasal cannula oxygen  Post-op Assessment: Report given to PACU RN and Post -op Vital signs reviewed and stable  Post vital signs: Reviewed and stable  Complications: No apparent anesthesia complications

## 2014-01-10 NOTE — Progress Notes (Signed)
Posted for BTL today with Dr. Penne LashLeggett. I saw patient and I agree with resident's note  Adam PhenixJames G Haizel Gatchell, MD 01/10/2014 8:00 AM

## 2014-01-10 NOTE — Anesthesia Postprocedure Evaluation (Signed)
  Anesthesia Post Note  Patient: Karen Kline  Procedure(s) Performed: Procedure(s) (LRB): POST PARTUM TUBAL LIGATION (Bilateral)  Anesthesia type: Spinal  Patient location: PACU  Post pain: Pain level controlled  Post assessment: Post-op Vital signs reviewed  Last Vitals:  Filed Vitals:   01/10/14 1430  BP: 78/48  Pulse: 75  Temp:   Resp:     Post vital signs: Reviewed  Level of consciousness: awake  Complications: No apparent anesthesia complications

## 2014-01-10 NOTE — Op Note (Signed)
Karen Kline 01/09/2014 - 01/10/2014  PREOPERATIVE DIAGNOSIS:  Multiparity, undesired fertility  POSTOPERATIVE DIAGNOSIS:  Multiparity, undesired fertility  PROCEDURE:  Postpartum Bilateral Tubal Sterilization using Filshie Clips   SURGEON: Dr.  Elsie LincolnKelly Leggett  ANESTHESIA:  Epidural and local analgesia using 0.5% Marcaine  COMPLICATIONS:  None immediate.  ESTIMATED BLOOD LOSS: 5 ml.   INDICATIONS: 38 y.o. [id number]  with undesired fertility,status post vaginal delivery, desires permanent sterilization.  Other reversible forms of contraception were discussed with patient; she declines all other modalities. Risks of procedure discussed with patient including but not limited to: risk of regret, permanence of method, bleeding, infection, injury to surrounding organs and need for additional procedures.  Failure risk of 0.5-1% with increased risk of ectopic gestation if pregnancy occurs was also discussed with patient.     FINDINGS:  Normal uterus, tubes, and ovaries.  PROCEDURE DETAILS: The patient was taken to the operating room where her epidural anesthesia was dosed up to surgical level and found to be adequate.  She was then placed in the dorsal supine position and prepped and draped in sterile fashion.  After an adequate timeout was performed, attention was turned to the patient's abdomen where a small transverse skin incision was made under the umbilical fold. The incision was taken down to the layer of fascia using the scalpel, and fascia was incised, and extended bilaterally using Mayo scissors. The peritoneum was entered in a sharp fashion. Attention was then turned to the patient's uterus, and left fallopian tube was identified and followed out to the fimbriated end.  A Filshie clip was placed on the left fallopian tube about 3 cm from the cornual attachment, with care given to incorporate the underlying mesosalpinx.  A similar process was carried out on the right side allowing for bilateral  tubal sterilization.  Good hemostasis was noted overall.  Local analgesia was injected into both Filshie application sites.The instruments were then removed from the patient's abdomen and the fascial incision was repaired with 0 Vicryl, and the skin was closed with a 4-0 Vicryl subcuticular stitch. Derma bond was also placed over the incision.  The patient tolerated the procedure well.  Instrument, sponge, and needle counts were correct times two.  The patient was then taken to the recovery room awake and in stable condition.

## 2014-01-10 NOTE — Anesthesia Postprocedure Evaluation (Signed)
  Anesthesia Post-op Note  Patient: Karen Kline  Procedure(s) Performed: * No procedures listed *  Patient Location: PACU and Mother/Baby  Anesthesia Type:Epidural  Level of Consciousness: awake, alert  and oriented  Airway and Oxygen Therapy: Patient Spontanous Breathing  Post-op Pain: none  Post-op Assessment: Post-op Vital signs reviewed, Patient's Cardiovascular Status Stable, No headache, No backache, No residual numbness and No residual motor weakness  Post-op Vital Signs: Reviewed and stable  Complications: No apparent anesthesia complications

## 2014-01-10 NOTE — Anesthesia Preprocedure Evaluation (Addendum)
Anesthesia Evaluation  Patient identified by MRN, date of birth, ID band Patient awake    Reviewed: Allergy & Precautions, H&P , NPO status , Patient's Chart, lab work & pertinent test results  Airway Mallampati: II TM Distance: >3 FB Neck ROM: full    Dental no notable dental hx.    Pulmonary Current Smoker,    Pulmonary exam normal       Cardiovascular negative cardio ROS      Neuro/Psych PSYCHIATRIC DISORDERS Anxiety Depression negative neurological ROS  negative psych ROS   GI/Hepatic negative GI ROS, Neg liver ROS,   Endo/Other  negative endocrine ROS  Renal/GU negative Renal ROS     Musculoskeletal   Abdominal Normal abdominal exam  (+)   Peds  Hematology negative hematology ROS (+)   Anesthesia Other Findings   Reproductive/Obstetrics                          Anesthesia Physical  Anesthesia Plan  ASA: II  Anesthesia Plan: Spinal   Post-op Pain Management:    Induction:   Airway Management Planned:   Additional Equipment:   Intra-op Plan:   Post-operative Plan:   Informed Consent: I have reviewed the patients History and Physical, chart, labs and discussed the procedure including the risks, benefits and alternatives for the proposed anesthesia with the patient or authorized representative who has indicated his/her understanding and acceptance.     Plan Discussed with:   Anesthesia Plan Comments:        Anesthesia Quick Evaluation

## 2014-01-10 NOTE — OR Nursing (Signed)
Patient arrived to operating room with no foley catheter.  New foley catheter inserted in OR at 1342 for procedure.

## 2014-01-10 NOTE — Progress Notes (Signed)
Patient was referred for history of depression/anxiety.  * Referral screened out by Clinical Social Worker because none of the following criteria appear to apply:  ~ History of anxiety/depression during this pregnancy, or of post-partum depression.  ~ Diagnosis of anxiety and/or depression within last 3 years  ~ History of depression due to pregnancy loss/loss of child  OR  * Patient's symptoms currently being treated with medication (Xanax) and/or therapy.  Please contact the Clinical Social Worker if needs arise, or by the patient's request.       

## 2014-01-10 NOTE — Progress Notes (Signed)
Post Partum Day 1 Subjective: no complaints, up ad lib, voiding, tolerating PO and + flatus  Objective: Blood pressure 111/74, pulse 81, temperature 98.3 F (36.8 C), temperature source Oral, resp. rate 18, height 5\' 4"  (1.626 m), weight 90.719 kg (200 lb), last menstrual period 04/12/2013, SpO2 99.00%, unknown if currently breastfeeding.  Physical Exam:  General: alert, cooperative and no distress Lochia: appropriate Uterine Fundus: firm Incision: na DVT Evaluation: No evidence of DVT seen on physical exam. Negative Homan's sign. No cords or calf tenderness. No significant calf/ankle edema.   Recent Labs  01/09/14 1605  HGB 12.7  HCT 37.7    Assessment/Plan: Plan for discharge tomorrow and Contraception BTL today; Bottle feeding for now and talking w/ pediatrics about breast feeding while taking xanax   LOS: 1 day   Karen Kline, Karen Kline 01/10/2014, 7:29 AM

## 2014-01-11 MED ORDER — IBUPROFEN 600 MG PO TABS: 600.0000 mg | ORAL_TABLET | Freq: Four times a day (QID) | ORAL | Status: DC

## 2014-01-11 MED ORDER — OXYCODONE-ACETAMINOPHEN 5-325 MG PO TABS: 1.0000 | ORAL_TABLET | ORAL | Status: DC | PRN

## 2014-01-11 NOTE — Lactation Note (Signed)
This note was copied from the chart of Karen Kline. Lactation Consultation Note    Karen ReamerFollow up consult with this mom and baby, now 40 hours pot partum, and baby full tem. Mom was on 0.5 mg xanax 3 times a day, throughout her pregnancy. The baby is being scored for with drawl, and her last 2 scores were 6 and then 9. Baby was irritable, slightly jittery and sneezing. Mom did agree to try breast feeding once, and baby latched and fed, and has been much calmer since. I spoke to pharmacist, Victorino DikeJennifer, about xanax, and she said it has a short half life, and at 40 hours pp, the baby may still see sign and symptoms for another day, and then they  Should begin to  decrease. I relayed this information to mom. I asked her if she wanted to breast feed or pump, and she was reluctant to say yes. I told her to speak to her pediatrician about this, see what he felt, and to call me if she decides to provide breat milk for her baby.   Patient Name: Karen Karen Reamerracy Canizares [id number]'[id number]'s Date: 01/11/2014 Reason for consult: Follow-up assessment   Maternal Data    Feeding    LATCH Score/Interventions                      Lactation Tools Discussed/Used     Consult Status Consult Status: Follow-up Follow-up type: Call as needed    Alfred LevinsLee, Chyann Ambrocio Anne 01/11/2014, 10:08 AM

## 2014-01-11 NOTE — Addendum Note (Signed)
Addendum created 01/11/14 0851 by Turner DanielsJennifer L Kalep Full, CRNA   Modules edited: Notes Section   Notes Section:  File: [id number]

## 2014-01-11 NOTE — Discharge Summary (Signed)
Obstetric Discharge Summary Reason for Admission: onset of labor Prenatal Procedures: none Intrapartum Procedures: spontaneous vaginal delivery Postpartum Procedures: P.P. tubal ligation Complications-Operative and Postpartum: none Hemoglobin  Date Value Ref Range Status  01/09/2014 12.7  12.0 - 15.0 g/dL Final     HCT  Date Value Ref Range Status  01/09/2014 37.7  36.0 - 46.0 % Final   Ms Montez MoritaCarter is a 37yo [id number] admitted at 40.1wks on 3/19 in active labor. She progressed to SVD shortly afterwards. On PPD#1 she underwent a BTL- tol the procedure well. On PPD#2 she was deemed to have received the full benefit of her hospital stay. She is currently bottlefeeding due to concern over breastfeeding with Xanax. [facility]  Physical Exam:  General: alert, cooperative and mild distress Lungs: nl effort Lochia: appropriate Uterine Fundus: firm Incision: lap inc dsg C, D, I DVT Evaluation: No evidence of DVT seen on physical exam.  Discharge Diagnoses: Term Pregnancy-delivered  Discharge Information: Date: 01/11/2014 Activity: pelvic rest Diet: routine Medications: PNV, Ibuprofen, Percocet and continue Xanax as prev Rx Condition: stable Instructions: refer to practice specific booklet Discharge to: home Follow-up Information   Follow up with [facility]. (Keep next scheduled appointment.)    Contact information:   [address] [place] [phone] [phone]      Newborn Data: Live born female  Birth Weight: 6 lb 8.9 oz (2974 g) APGAR: 9, 9  Home with mother, or may possibly stay as a baby pt for withdrawal obs.  Cam HaiSHAW, KIMBERLY 01/11/2014, 8:39 AM

## 2014-01-11 NOTE — Anesthesia Postprocedure Evaluation (Signed)
  Anesthesia Post-op Note  Anesthesia Post Note  Patient: Karen Kline  Procedure(s) Performed: Procedure(s) (LRB): POST PARTUM TUBAL LIGATION (Bilateral)  Anesthesia type: Spinal  Patient location: Mother/Baby  Post pain: Pain level controlled  Post assessment: Post-op Vital signs reviewed  Last Vitals:  Filed Vitals:   01/11/14 0406  BP: 113/73  Pulse: 97  Temp: 36.8 C  Resp: 20    Post vital signs: Reviewed  Level of consciousness: awake  Complications: No apparent anesthesia complications

## 2014-01-11 NOTE — Discharge Instructions (Signed)
Laparoscopic Tubal Ligation °Care After °Refer to this sheet in the next few weeks. These instructions provide you with information on caring for yourself after your procedure. Your caregiver may also give you more specific instructions. Your treatment has been planned according to current medical practices, but problems sometimes occur. Call your caregiver if you have any problems or questions after your procedure. °HOME CARE INSTRUCTIONS  °· Rest the remainder of the day. °· Only take over-the-counter or prescription medicines for pain, discomfort, or fever as directed by your caregiver. Do not take aspirin. It can cause bleeding. °· Gradually resume daily activities, diet, rest, driving, and work. °· Avoid sexual intercourse for 2 weeks or as directed. °· Do not use tampons or douche. °· Do not drive while taking pain medicine. °· Do not lift anything over 5 pounds for 2 weeks or as directed. °· Only take showers, not baths, until you are seen by your caregiver. °· Change bandages (dressings) as directed. °· Take your temperature twice a day and record it. °· Try to have help for the first 7 to 10 days for your household needs. °· Return to your caregiver to get your stitches (sutures) removed and for follow-up visits as directed. °SEEK MEDICAL CARE IF:  °· You have redness, swelling, or increasing pain in a wound. °· You have drainage from a wound lasting longer than 1 day. °· Your pain is getting worse. °· You have a rash. °· You become dizzy or lightheaded. °· You have a reaction to your medicine. °· You need stronger medicine or a change in your pain medicine. °· You notice a bad smell coming from a wound or dressing. °· Your wound breaks open after the sutures have been removed. °· You are constipated. °SEEK IMMEDIATE MEDICAL CARE IF:  °· You faint. °· You have a fever. °· You have increasing abdominal pain. °· You have severe pain in your shoulders. °· You have bleeding or drainage from the suture sites or  vagina following surgery. °· You have shortness of breath or difficulty breathing. °· You have chest or leg pain. °· You have persistent nausea, vomiting, or diarrhea. °MAKE SURE YOU:  °· Understand these instructions. °· Watch your condition. °· Get help right away if you are not doing well or get worse. °Document Released: 04/29/2005 Document Revised: 04/10/2012 Document Reviewed: 01/21/2012 °ExitCare® Patient Information ©2014 [employer]. °Vaginal Delivery °Care After °Refer to this sheet in the next few weeks. These discharge instructions provide you with information on caring for yourself after delivery. Your caregiver may also give you specific instructions. Your treatment has been planned according to the most current medical practices available, but problems sometimes occur. Call your caregiver if you have any problems or questions after you go home. °HOME CARE INSTRUCTIONS °· Take over-the-counter or prescription medicines only as directed by your caregiver or pharmacist. °· Do not drink alcohol, especially if you are breastfeeding or taking medicine to relieve pain. °· Do not chew or smoke tobacco. °· Do not use illegal drugs. °· Continue to use good perineal care. Good perineal care includes: °· Wiping your perineum from front to back. °· Keeping your perineum clean. °· Do not use tampons or douche until your caregiver says it is okay. °· Shower, wash your hair, and take tub baths as directed by your caregiver. °· Wear a well-fitting bra that provides breast support. °· Eat healthy foods. °· Drink enough fluids to keep your urine clear or pale yellow. °· Eat   high-fiber foods such as whole grain cereals and breads, brown rice, beans, and fresh fruits and vegetables every day. These foods may help prevent or relieve constipation. °· Follow your cargiver's recommendations regarding resumption of activities such as climbing stairs, driving, lifting, exercising, or traveling. °· Talk to your caregiver about  resuming sexual activities. Resumption of sexual activities is dependent upon your risk of infection, your rate of healing, and your comfort and desire to resume sexual activity. °· Try to have someone help you with your household activities and your newborn for at least a few days after you leave the hospital. °· Rest as much as possible. Try to rest or take a nap when your newborn is sleeping. °· Increase your activities gradually. °· Keep all of your scheduled postpartum appointments. It is very important to keep your scheduled follow-up appointments. At these appointments, your caregiver will be checking to make sure that you are healing physically and emotionally. °SEEK MEDICAL CARE IF:  °· You are passing large clots from your vagina. Save any clots to show your caregiver. °· You have a foul smelling discharge from your vagina. °· You have trouble urinating. °· You are urinating frequently. °· You have pain when you urinate. °· You have a change in your bowel movements. °· You have increasing redness, pain, or swelling near your vaginal incision (episiotomy) or vaginal tear. °· You have pus draining from your episiotomy or vaginal tear. °· Your episiotomy or vaginal tear is separating. °· You have painful, hard, or reddened breasts. °· You have a severe headache. °· You have blurred vision or see spots. °· You feel sad or depressed. °· You have thoughts of hurting yourself or your newborn. °· You have questions about your care, the care of your newborn, or medicines. °· You are dizzy or lightheaded. °· You have a rash. °· You have nausea or vomiting. °· You were breastfeeding and have not had a menstrual period within 12 weeks after you stopped breastfeeding. °· You are not breastfeeding and have not had a menstrual period by the 12th week after delivery. °· You have a fever. °SEEK IMMEDIATE MEDICAL CARE IF:  °· You have persistent pain. °· You have chest pain. °· You have shortness of breath. °· You  faint. °· You have leg pain. °· You have stomach pain. °· Your vaginal bleeding saturates two or more sanitary pads in 1 hour. °MAKE SURE YOU:  °· Understand these instructions. °· Will watch your condition. °· Will get help right away if you are not doing well or get worse. °Document Released: 10/07/2000 Document Revised: 07/04/2012 Document Reviewed: 06/06/2012 °ExitCare® Patient Information ©2014 [employer]. ° °

## 2014-01-12 ENCOUNTER — Ambulatory Visit: Payer: Self-pay

## 2014-01-12 NOTE — Lactation Note (Signed)
This note was copied from the chart of Karen Joana Reamerracy Ruddell. Lactation Consultation Note    Follow up consult with this mom , now 62 hours post partum, and engorged. Mom is not breast feeding. She is wearing a tight fiting bra. i advised her to not express milk, as this will increase her supply. I got her ice packs, and advised her to apply for 20 minutes every 1-2 hours. Mom knows to call for questions/xonxerns  Patient Name: Karen Joana Reamerracy Cristobal XBMWU'[id number]'s Date: 01/12/2014 Reason for consult: Follow-up assessment   Maternal Data    Feeding Feeding Type: Bottle Fed - Formula  LATCH Score/Interventions          Comfort (Breast/Nipple): Engorged, cracked, bleeding, large blisters, severe discomfort Problem noted: Engorgment Intervention(s): Ice;Cabbage leaves           Lactation Tools Discussed/Used Tools: Comfort gels   Consult Status Consult Status: Complete Follow-up type: Call as needed    Karen Kline, Karen Kline 01/12/2014, 11:08 AM

## 2014-01-12 NOTE — Discharge Summary (Signed)
Attestation of Attending Supervision of Advanced Practitioner: Evaluation and management procedures were performed by the PA/NP/CNM/OB Fellow under my supervision/collaboration. Chart reviewed and agree with management and plan.  Maxxwell Edgett V 01/12/2014 11:25 PM     Attestation of Attending Supervision of Advanced Practitioner: Evaluation and management procedures were performed by the PA/NP/CNM/OB Fellow under my supervision/collaboration. Chart reviewed and agree with management and plan.  Aoki Wedemeyer V 01/12/2014 11:25 PM

## 2014-01-13 ENCOUNTER — Encounter (HOSPITAL_COMMUNITY): Payer: Self-pay | Admitting: Obstetrics & Gynecology

## 2014-01-13 LAB — POCT URINALYSIS DIPSTICK
Glucose, UA: NEGATIVE
PROTEIN UA: NEGATIVE

## 2014-01-15 ENCOUNTER — Encounter: Payer: Self-pay | Admitting: *Deleted

## 2014-02-05 ENCOUNTER — Ambulatory Visit: Payer: Medicaid Other | Admitting: Advanced Practice Midwife

## 2014-02-05 ENCOUNTER — Ambulatory Visit: Payer: Medicaid Other | Admitting: Obstetrics and Gynecology

## 2014-02-11 ENCOUNTER — Encounter: Payer: Self-pay | Admitting: Advanced Practice Midwife

## 2014-02-11 ENCOUNTER — Ambulatory Visit (INDEPENDENT_AMBULATORY_CARE_PROVIDER_SITE_OTHER): Payer: Medicaid Other | Admitting: Advanced Practice Midwife

## 2014-02-11 DIAGNOSIS — Z8481 Family history of carrier of genetic disease: Secondary | ICD-10-CM | POA: Insufficient documentation

## 2014-02-11 NOTE — Progress Notes (Signed)
  Karen Kline is a 38 y.o. who presents for a postpartum visit. She is 4 weeks postpartum following a spontaneous vaginal delivery. I have fully reviewed the prenatal and intrapartum course. The delivery was at 40.1 gestational weeks.  Anesthesia: epidural. Postpartum course has been uneventful. Baby's course has been uneventful. Baby is feeding by bottle. Bleeding: no bleeding. Bowel function is normal. Bladder function is normal. Patient is not sexually active. Contraception method is tubal ligation and done immediately post partum. Postpartum depression screening: negative.  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  Did not get salpingectomy (discussed it, but pt didn't mention until today about possibility of being a BrCa gene carrier).    Review of Systems   Constitutional: Negative for fever and chills Eyes: Negative for visual disturbances Respiratory: Negative for shortness of breath, dyspnea Cardiovascular: Negative for chest pain or palpitations  Gastrointestinal: Negative for vomiting, diarrhea and constipation Genitourinary: Negative for dysuria and urgency Musculoskeletal: Negative for back pain, joint pain, myalgias  Neurological: Negative for dizziness and headaches   Objective:     Filed Vitals:   02/11/14 1429  BP: 110/88   General:  alert, cooperative and no distress   Breasts:  negative  Lungs: clear to auscultation bilaterally  Heart:  regular rate and rhythm  Abdomen: Soft, nontender   Vulva:  normal  Vagina: normal vagina  Cervix:  closed  Corpus: Well involuted     Rectal Exam: no hemorrhoids        Assessment:    normal postpartum exam.  Plan:    1. Contraception: tubal ligation 2. Follow up as needed.

## 2014-03-25 ENCOUNTER — Encounter: Payer: Self-pay | Admitting: *Deleted

## 2014-08-08 ENCOUNTER — Other Ambulatory Visit: Payer: Self-pay

## 2014-08-25 ENCOUNTER — Encounter: Payer: Self-pay | Admitting: Advanced Practice Midwife

## 2018-02-23 ENCOUNTER — Emergency Department (HOSPITAL_COMMUNITY): Payer: Medicaid Other

## 2018-02-23 ENCOUNTER — Emergency Department (HOSPITAL_COMMUNITY)
Admission: EM | Admit: 2018-02-23 | Discharge: 2018-02-24 | Disposition: A | Discharge status: Home / Self Care | Payer: Medicaid Other | Attending: Emergency Medicine | Admitting: Emergency Medicine

## 2018-02-23 ENCOUNTER — Other Ambulatory Visit: Payer: Self-pay

## 2018-02-23 ENCOUNTER — Encounter (HOSPITAL_COMMUNITY): Payer: Self-pay

## 2018-02-23 DIAGNOSIS — F17210 Nicotine dependence, cigarettes, uncomplicated: Secondary | ICD-10-CM | POA: Insufficient documentation

## 2018-02-23 DIAGNOSIS — Z79899 Other long term (current) drug therapy: Secondary | ICD-10-CM | POA: Insufficient documentation

## 2018-02-23 DIAGNOSIS — R0789 Other chest pain: Secondary | ICD-10-CM | POA: Diagnosis not present

## 2018-02-23 DIAGNOSIS — H81399 Other peripheral vertigo, unspecified ear: Secondary | ICD-10-CM | POA: Diagnosis not present

## 2018-02-23 DIAGNOSIS — R42 Dizziness and giddiness: Secondary | ICD-10-CM | POA: Diagnosis present

## 2018-02-23 DIAGNOSIS — H6123 Impacted cerumen, bilateral: Secondary | ICD-10-CM | POA: Diagnosis not present

## 2018-02-23 LAB — CBC
HEMATOCRIT: 41.1 % (ref 36.0–46.0)
HEMOGLOBIN: 13.2 g/dL (ref 12.0–15.0)
MCH: 30.3 pg (ref 26.0–34.0)
MCHC: 32.1 g/dL (ref 30.0–36.0)
MCV: 94.5 fL (ref 78.0–100.0)
Platelets: 259 10*3/uL (ref 150–400)
RBC: 4.35 MIL/uL (ref 3.87–5.11)
RDW: 13.4 % (ref 11.5–15.5)
WBC: 8.5 10*3/uL (ref 4.0–10.5)

## 2018-02-23 LAB — BASIC METABOLIC PANEL
ANION GAP: 10 (ref 5–15)
BUN: 8 mg/dL (ref 6–20)
CALCIUM: 9.1 mg/dL (ref 8.9–10.3)
CO2: 26 mmol/L (ref 22–32)
Chloride: 102 mmol/L (ref 101–111)
Creatinine, Ser: 0.64 mg/dL (ref 0.44–1.00)
GFR calc non Af Amer: >60 mL/min (ref >60)
GLUCOSE: 93 mg/dL (ref 65–99)
POTASSIUM: 3.6 mmol/L (ref 3.5–5.1)
Sodium: 138 mmol/L (ref 135–145)

## 2018-02-23 LAB — PREGNANCY, URINE: PREG TEST UR: NEGATIVE

## 2018-02-23 LAB — TROPONIN I

## 2018-02-23 MED ORDER — ONDANSETRON 4 MG PO TBDP
4.0000 mg | ORAL_TABLET | Freq: Once | ORAL | Status: AC
  Administered 2018-02-24: 4 mg via ORAL
  Filled 2018-02-23: qty 1

## 2018-02-23 MED ORDER — MECLIZINE HCL 12.5 MG PO TABS
25.0000 mg | ORAL_TABLET | Freq: Once | ORAL | Status: AC
  Administered 2018-02-24: 25 mg via ORAL
  Filled 2018-02-23: qty 2

## 2018-02-23 NOTE — ED Triage Notes (Signed)
Pt reports she feels like she has chest congestion.  Today with dizziness she relates to previous bouts of vertigo.  Pt also reports her eyes have been watering.

## 2018-02-23 NOTE — ED Notes (Signed)
Pt here with spouse who has also checked in She reports CP, cough, dizzy when turning head one way Then reports anxiety, heart problems when she lived in [place], yet when she moved here her physician in [place] said she was fine per pt- she reports having wax in her ears, coughing, taking alka selzer and reporting that she does not take tylenol nor ibuprofen because she used to drink and it made her sick to take thise meds  She continues with maladies and complaints of various types and when asked if she has mentioned to her physician some are yes, some no

## 2018-02-23 NOTE — ED Notes (Signed)
Pt with strong smell of cigarettes  Report 1-2 PPD for years Cough that cannot cough up per pt CP  Vertigo when she turns head one way

## 2018-02-23 NOTE — ED Provider Notes (Signed)
[facility] EMERGENCY DEPARTMENT Provider Note   CSN: [id number] Arrival date & time: 02/23/18  1956     History   Chief Complaint Chief Complaint  Patient presents with  . Dizziness    chest congestion  . Chest Pain    HPI Karen Kline is a 42 y.o. female.  Patient with intermittent room spinning dizziness since yesterday.  She reports this is worse when she turns her head a certain way.  It lasts for several seconds to minutes at a time.  She has had this in the past with vertigo caused by excessive wax in her ears.  She denies any headache, chills, nausea or vomiting.  She was seen by her eye doctor yesterday for watery eyes and states she was giving an eye numbing drop but no eye dilation.  She denies any double vision or blurry vision now.  She denies any focal weakness, numbness or tingling.  She denies any difficulty breathing or difficulty swallowing. She also reports 2 days of chest tightness and congestion.  Has had a nonproductive cough.  She is a smoker with no history of asthma.  Reports chest tightness that is worse with palpation and movement.  The history is provided by the patient.  Dizziness  Associated symptoms: chest pain   Associated symptoms: no headaches, no nausea, no shortness of breath, no vomiting and no weakness   Chest Pain   Associated symptoms include back pain, cough and dizziness. Pertinent negatives include no abdominal pain, no fever, no headaches, no nausea, no shortness of breath, no vomiting and no weakness.    Past Medical History:  Diagnosis Date  . Abnormal Pap smear   . Anxiety   . Depression     Patient Active Problem List   Diagnosis Date Noted  . Family history of BRCA gene positive 02/11/2014  . Pregnancy 01/10/2014  . Active labor 01/09/2014  . Pregnant 06/27/2013  . History of loop electrical excision procedure (LEEP) 06/27/2013  . Depression 06/27/2013  . Anxiety 06/27/2013    Past Surgical History:  Procedure  Laterality Date  . CHOLECYSTECTOMY, LAPAROSCOPIC    . LEEP  2005  . tooth abstraction    . TUBAL LIGATION Bilateral 01/10/2014   Procedure: POST PARTUM TUBAL LIGATION;  Surgeon: Guss Bunde, MD;  Location: [facility];  Service: Gynecology;  Laterality: Bilateral;     OB History    Gravida  7   Para  5   Term  5   Preterm      AB  2   Living  5     SAB  1   TAB  1   Ectopic      Multiple      Live Births  5            Home Medications    Prior to Admission medications   Medication Sig Start Date End Date Taking? Authorizing Provider  ALPRAZolam Duanne Moron) 1 MG tablet Take 1 mg by mouth 3 (three) times daily as needed.    Yes [provider]  Aromatic Inhalants (VICKS VAPOR IN) Inhale into the lungs daily.   Yes [provider]  Chlorphen-Phenyleph-ASA (ALKA-SELTZER PLUS COLD) 2-7.8-325 MG TBEF Take 1 tablet by mouth once.   Yes [provider]  gabapentin (NEURONTIN) 300 MG capsule Take 300 mg by mouth 3 (three) times daily. 12/10/17  Yes [provider]  lisdexamfetamine (VYVANSE) 50 MG capsule Take 50 mg by mouth every morning. 01/15/18  Yes [provider]  PATADAY 0.2 % SOLN Place 1 drop into both eyes daily. 02/22/18  Yes [provider]  polyvinyl alcohol (ARTIFICIAL TEARS) 1.4 % ophthalmic solution Place 1 drop into both eyes as needed for dry eyes.   Yes [provider]  PROAIR HFA 108 (90 Base) MCG/ACT inhaler INHALE TWO PUFFS EVERY 4 TO 6 HOURS AS NEEDED 11/23/17  Yes [provider]  pseudoephedrine-guaifenesin (MUCINEX D) 60-600 MG 12 hr tablet Take 1 tablet by mouth daily as needed for congestion.   Yes [provider]  QUEtiapine (SEROQUEL) 50 MG tablet Take 50 mg by mouth at bedtime. 01/15/18  Yes [provider]  zolpidem (AMBIEN) 10 MG tablet Take 10 mg by mouth at bedtime as needed. for sleep 02/10/18  Yes [provider]    Family History Family History    Problem Relation Age of Onset  . Cancer Other        breast  . Emphysema Other        father  . Heart disease Daughter        congenital heart defect  . Stroke Maternal Grandfather   . Cancer Mother 7       + BrCa1    Social History Social History   Tobacco Use  . Smoking status: Current Every Day Smoker    Packs/day: 2.00    Types: Cigarettes  . Smokeless tobacco: Never Used  Substance Use Topics  . Alcohol use: No  . Drug use: No     Allergies   Penicillins   Review of Systems Review of Systems  Constitutional: Negative for activity change, appetite change and fever.  HENT: Positive for congestion and rhinorrhea. Negative for nosebleeds and sore throat.   Eyes: Negative for photophobia and visual disturbance.  Respiratory: Positive for cough and chest tightness. Negative for shortness of breath.   Cardiovascular: Positive for chest pain.  Gastrointestinal: Negative for abdominal pain, nausea and vomiting.  Genitourinary: Negative for dysuria and hematuria.  Musculoskeletal: Positive for arthralgias, back pain and myalgias.  Neurological: Positive for dizziness. Negative for syncope, weakness and headaches.   all other systems are negative except as noted in the HPI and PMH.     Physical Exam Updated Vital Signs BP 102/77   Pulse 65   Temp 98.5 F (36.9 C) (Oral)   Resp 16   Ht 5' 4" (1.626 m)   Wt 77.1 kg (170 lb)   LMP 02/11/2018 (Approximate)   SpO2 100%   BMI 29.18 kg/m   Physical Exam  Constitutional: She is oriented to person, place, and time. She appears well-developed and well-nourished. No distress.  HENT:  Head: Normocephalic and atraumatic.  Mouth/Throat: Oropharynx is clear and moist. No oropharyngeal exudate.  Cerumen impaction bilaterally  Eyes: Pupils are equal, round, and reactive to light. Conjunctivae and EOM are normal.  Neck: Normal range of motion. Neck supple.  No meningismus.  Cardiovascular: Normal rate, regular rhythm,  normal heart sounds and intact distal pulses.  No murmur heard. Pulmonary/Chest: Effort normal and breath sounds normal. No respiratory distress. She exhibits tenderness.  Abdominal: Soft. There is no tenderness. There is no rebound and no guarding.  Musculoskeletal: Normal range of motion. She exhibits no edema or tenderness.  Neurological: She is alert and oriented to person, place, and time. No cranial nerve deficit. She exhibits normal muscle tone. Coordination normal.  CN 2-12 intact, no ataxia on finger to nose, no nystagmus, 5/5 strength throughout, no pronator  drift, Romberg negative, normal gait.  No nystagmus.  Test of skew negative.  Head impulse testing negative  Skin: Skin is warm. Capillary refill takes less than 2 seconds.  Psychiatric: She has a normal mood and affect. Her behavior is normal.  Nursing note and vitals reviewed.    ED Treatments / Results  Labs (all labs ordered are listed, but only abnormal results are displayed) Labs Reviewed  BASIC METABOLIC PANEL  CBC  PREGNANCY, URINE  TROPONIN I  TROPONIN I  D-DIMER, QUANTITATIVE (NOT AT [facility])    EKG EKG Interpretation  Date/Time:  Friday Feb 23 2018 21:41:16 EDT Ventricular Rate:  72 PR Interval:    QRS Duration: 98 QT Interval:  399 QTC Calculation: 437 R Axis:   9 Text Interpretation:  Sinus rhythm No previous ECGs available Confirmed by Ezequiel Essex [phone]) on 02/23/2018 11:45:45 PM   Radiology Dg Chest 2 View  Result Date: 02/23/2018 CLINICAL DATA:  42 year old female with chest pain and shortness of breath. EXAM: CHEST - 2 VIEW COMPARISON:  Chest radiograph dated 12/10/2017 FINDINGS: The heart size and mediastinal contours are within normal limits. Both lungs are clear. The visualized skeletal structures are unremarkable. IMPRESSION: No active cardiopulmonary disease. Electronically Signed   By: Anner Crete M.D.   On: 02/23/2018 23:11    Procedures Procedures (including critical care  time)  Medications Ordered in ED Medications  meclizine (ANTIVERT) tablet 25 mg (has no administration in time range)  ondansetron (ZOFRAN-ODT) disintegrating tablet 4 mg (has no administration in time range)     Initial Impression / Assessment and Plan / ED Course  I have reviewed the triage vital signs and the nursing notes.  Pertinent labs & imaging results that were available during my care of the patient were reviewed by me and considered in my medical decision making (see chart for details).    Patient with 2 days of intermittent vertigo with worse with head movements.  No focal weakness, numbness or tingling. 2 days of chest tightness worse with palpation.  EKG is nonischemic.  Cerumen removal performed by nursing staff.  Patient reports dizziness has improved with meclizine.  She has a nonfocal neurological exam was tolerating p.o. and ambulatory.  Low suspicion for central cause of vertigo.  No ataxia on finger-to-nose and no nystagmus.  Chest pain seems atypical for ACS.  Troponin negative x2 with negative d-dimer.  Chest x-ray is negative.  Patient will be treated for peripheral vertigo as well as atypical chest pain.  Smoking cessation advised she is not wheezing.  Will cover with antibiotics given her smoking history.  Follow-up with PCP.  Return precautions discussed.  Final Clinical Impressions(s) / ED Diagnoses   Final diagnoses:  Peripheral vertigo, unspecified laterality  Bilateral impacted cerumen  Atypical chest pain    ED Discharge Orders    None       Zyriah Mask, Annie Main, MD 02/24/18 [phone]

## 2018-02-23 NOTE — ED Notes (Signed)
From Rad 

## 2018-02-24 LAB — TROPONIN I

## 2018-02-24 LAB — D-DIMER, QUANTITATIVE: D-Dimer, Quant: 0.47 ug/mL-FEU (ref 0.00–0.50)

## 2018-02-24 MED ORDER — HYDROGEN PEROXIDE 3 % EX SOLN
CUTANEOUS | Status: AC
  Filled 2018-02-24: qty 473

## 2018-02-24 MED ORDER — DOXYCYCLINE HYCLATE 100 MG PO CAPS: 100.0000 mg | ORAL_CAPSULE | Freq: Two times a day (BID) | ORAL | 0 refills | Status: AC

## 2018-02-24 MED ORDER — MECLIZINE HCL 25 MG PO TABS: 25.0000 mg | ORAL_TABLET | Freq: Three times a day (TID) | ORAL | 0 refills | Status: AC | PRN

## 2018-02-24 NOTE — Discharge Instructions (Addendum)
There is no evidence of heart attack or blood clot in the lung.  Take the dizziness medications as prescribed.  Stop smoking.  Your dizziness is likely caused by your earwax you may continue to take over-the-counter Debrox drops.  Return to the ED if you develop new or worsening symptoms.

## 2019-01-01 ENCOUNTER — Ambulatory Visit (INDEPENDENT_AMBULATORY_CARE_PROVIDER_SITE_OTHER): Payer: Medicaid Other | Admitting: Internal Medicine

## 2019-01-26 IMAGING — DX DG CHEST 2V
2 series · 2 of 2 positions shown · non-contrast
Comparison: Chest radiograph dated 12/10/2017

CLINICAL DATA: 41-year-old female with chest pain and shortness of
breath.

EXAM:
CHEST - 2 VIEW

[chest pa]
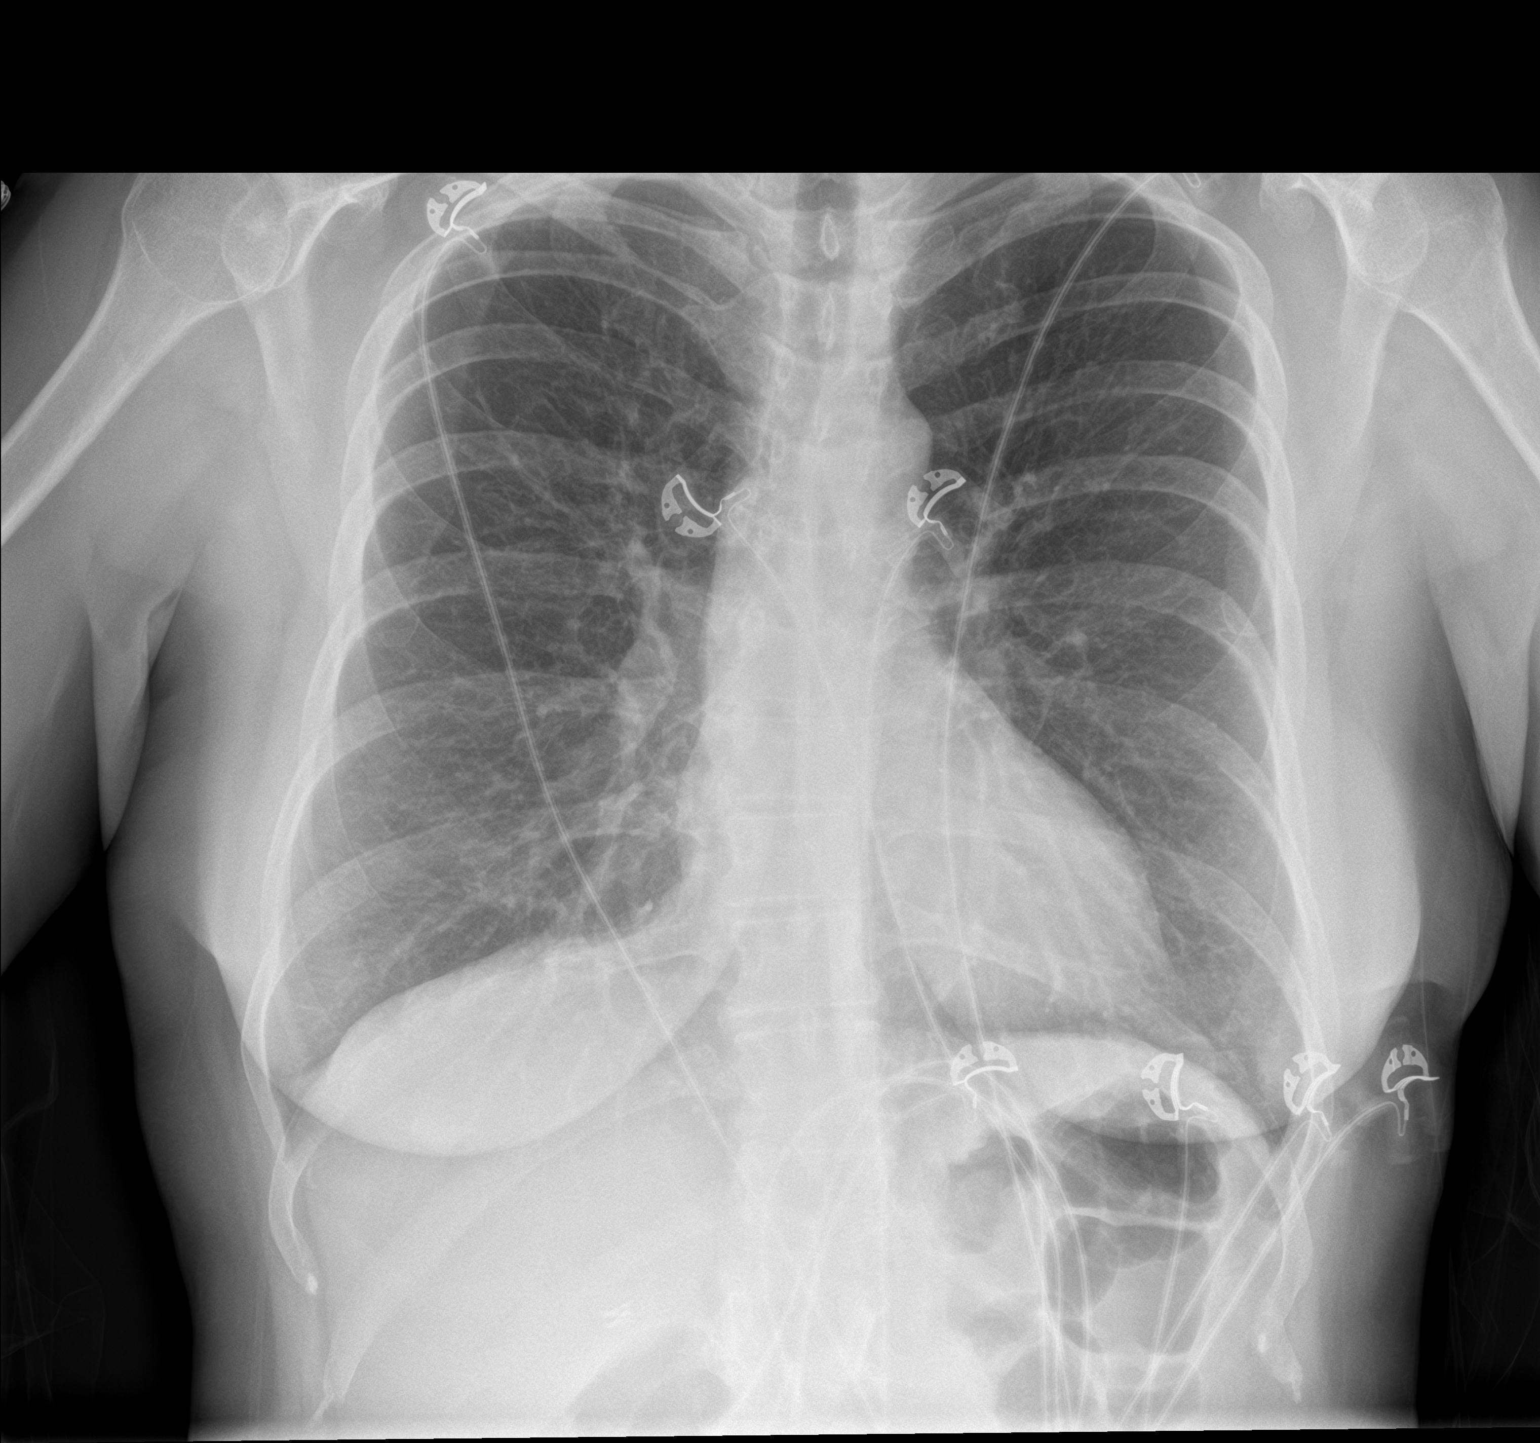

[chest lat]
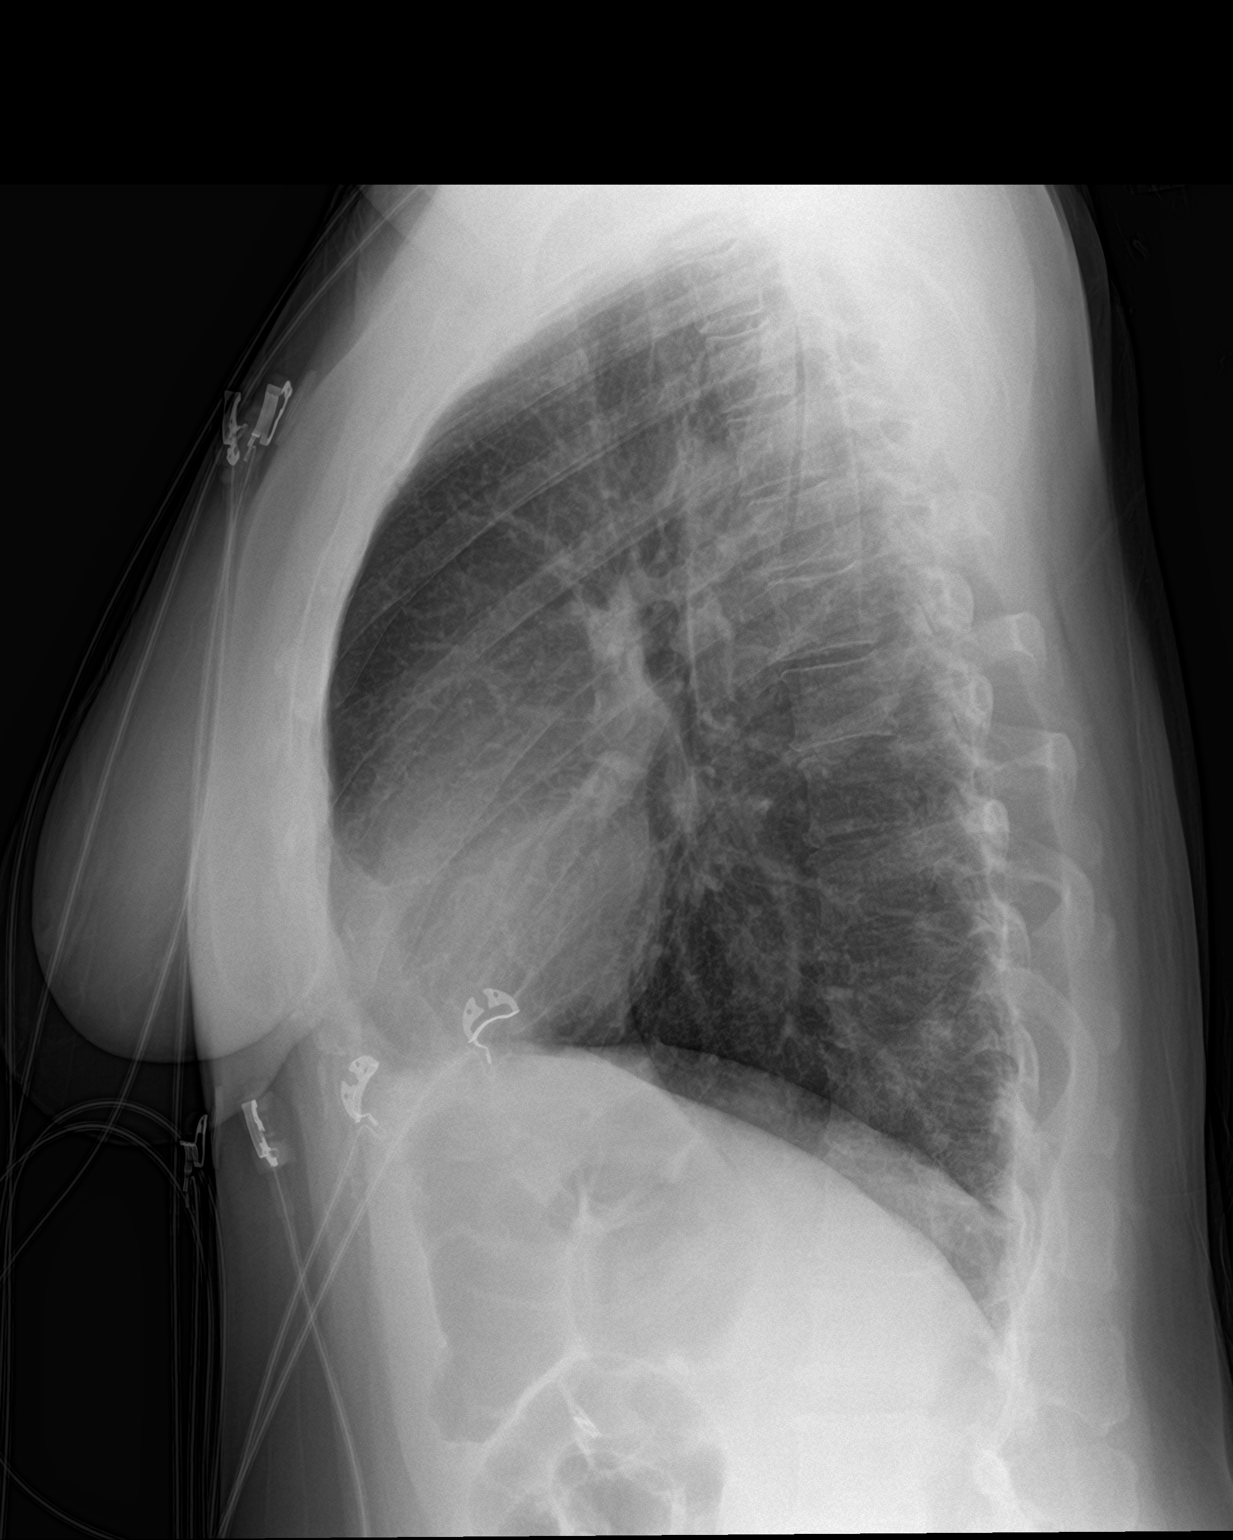

[2 of 2 positions shown; findings below may reference images not displayed]

FINDINGS: The heart size and mediastinal contours are within normal limits.
Both lungs are clear. The visualized skeletal structures are
unremarkable.
IMPRESSION: No active cardiopulmonary disease.

## 2023-03-06 ENCOUNTER — Telehealth: Payer: Self-pay

## 2023-03-06 NOTE — Telephone Encounter (Signed)
Our office received a referral from Dr. Olena Leatherwood office I called the patient no answer. I left a voice mail to call me back as well as sent a my chart message. ep

## 2023-03-08 ENCOUNTER — Telehealth: Payer: Self-pay

## 2023-03-08 NOTE — Telephone Encounter (Signed)
I called the patient again today no answer, I was able to leave a voice mail for the patient to call the office. ep

## 2023-03-13 ENCOUNTER — Telehealth: Payer: Self-pay

## 2023-03-13 NOTE — Telephone Encounter (Signed)
I called patient again today in regards to a referral that we received from her primary to schedule appointment no answer. I did leave a voice to call me back to schedule appt. ep
